# Patient Record
Sex: Female | Born: 1940 | Race: Black or African American | Hispanic: No | Smoking: Never smoker
Health system: Southern US, Community
[De-identification: ages and names within clinical notes are randomized; demographics above are authoritative.]

## PROBLEM LIST (undated history)

## (undated) DIAGNOSIS — E669 Obesity, unspecified: Secondary | ICD-10-CM

## (undated) DIAGNOSIS — R778 Other specified abnormalities of plasma proteins: Secondary | ICD-10-CM

## (undated) DIAGNOSIS — J309 Allergic rhinitis, unspecified: Secondary | ICD-10-CM

## (undated) DIAGNOSIS — G9340 Encephalopathy, unspecified: Secondary | ICD-10-CM

## (undated) DIAGNOSIS — R7989 Other specified abnormal findings of blood chemistry: Secondary | ICD-10-CM

## (undated) DIAGNOSIS — I10 Essential (primary) hypertension: Secondary | ICD-10-CM

## (undated) DIAGNOSIS — E876 Hypokalemia: Secondary | ICD-10-CM

## (undated) HISTORY — DX: Allergic rhinitis, unspecified: J30.9

## (undated) HISTORY — PX: FOOT SURGERY: SHX648

## (undated) HISTORY — DX: Obesity, unspecified: E66.9

---

## 1998-01-15 ENCOUNTER — Ambulatory Visit (HOSPITAL_COMMUNITY): Admission: RE | Admit: 1998-01-15 | Discharge: 1998-01-15 | Payer: Self-pay | Admitting: Internal Medicine

## 1998-02-09 ENCOUNTER — Emergency Department (HOSPITAL_COMMUNITY): Admission: EM | Admit: 1998-02-09 | Discharge: 1998-02-09 | Payer: Self-pay | Admitting: Emergency Medicine

## 1998-06-02 ENCOUNTER — Ambulatory Visit (HOSPITAL_COMMUNITY): Admission: RE | Admit: 1998-06-02 | Discharge: 1998-06-02 | Payer: Self-pay | Admitting: *Deleted

## 1998-08-30 ENCOUNTER — Ambulatory Visit (HOSPITAL_COMMUNITY): Admission: RE | Admit: 1998-08-30 | Discharge: 1998-08-30 | Payer: Self-pay | Admitting: Internal Medicine

## 1999-08-31 ENCOUNTER — Other Ambulatory Visit: Admission: RE | Admit: 1999-08-31 | Discharge: 1999-08-31 | Payer: Self-pay | Admitting: Internal Medicine

## 2001-04-07 ENCOUNTER — Other Ambulatory Visit: Admission: RE | Admit: 2001-04-07 | Discharge: 2001-04-07 | Payer: Self-pay | Admitting: Emergency Medicine

## 2002-06-11 ENCOUNTER — Encounter: Admission: RE | Admit: 2002-06-11 | Discharge: 2002-06-11 | Payer: Self-pay | Admitting: Internal Medicine

## 2002-06-11 ENCOUNTER — Encounter: Payer: Self-pay | Admitting: Internal Medicine

## 2002-07-30 ENCOUNTER — Other Ambulatory Visit: Admission: RE | Admit: 2002-07-30 | Discharge: 2002-07-30 | Payer: Self-pay | Admitting: Internal Medicine

## 2003-09-30 ENCOUNTER — Encounter: Admission: RE | Admit: 2003-09-30 | Discharge: 2003-09-30 | Payer: Self-pay | Admitting: Internal Medicine

## 2004-05-18 ENCOUNTER — Other Ambulatory Visit: Admission: RE | Admit: 2004-05-18 | Discharge: 2004-05-18 | Payer: Self-pay | Admitting: Internal Medicine

## 2004-06-08 ENCOUNTER — Encounter: Admission: RE | Admit: 2004-06-08 | Discharge: 2004-06-08 | Payer: Self-pay | Admitting: Internal Medicine

## 2005-10-25 ENCOUNTER — Other Ambulatory Visit: Admission: RE | Admit: 2005-10-25 | Discharge: 2005-10-25 | Payer: Self-pay | Admitting: Internal Medicine

## 2005-11-01 ENCOUNTER — Encounter: Admission: RE | Admit: 2005-11-01 | Discharge: 2005-11-01 | Payer: Self-pay | Admitting: Internal Medicine

## 2009-03-27 ENCOUNTER — Other Ambulatory Visit: Admission: RE | Admit: 2009-03-27 | Discharge: 2009-03-27 | Payer: Self-pay | Admitting: Internal Medicine

## 2009-03-28 ENCOUNTER — Encounter: Admission: RE | Admit: 2009-03-28 | Discharge: 2009-03-28 | Payer: Self-pay | Admitting: Internal Medicine

## 2010-03-30 ENCOUNTER — Other Ambulatory Visit: Admission: RE | Admit: 2010-03-30 | Discharge: 2010-03-30 | Payer: Self-pay | Admitting: Internal Medicine

## 2010-04-06 ENCOUNTER — Encounter: Admission: RE | Admit: 2010-04-06 | Discharge: 2010-04-06 | Payer: Self-pay | Admitting: Internal Medicine

## 2011-06-30 ENCOUNTER — Emergency Department (HOSPITAL_COMMUNITY): Payer: BC Managed Care – PPO

## 2011-06-30 ENCOUNTER — Emergency Department (HOSPITAL_COMMUNITY)
Admission: EM | Admit: 2011-06-30 | Discharge: 2011-06-30 | Disposition: A | Discharge status: Home / Self Care | Payer: BC Managed Care – PPO | Attending: Emergency Medicine | Admitting: Emergency Medicine

## 2011-06-30 DIAGNOSIS — Z79899 Other long term (current) drug therapy: Secondary | ICD-10-CM | POA: Insufficient documentation

## 2011-06-30 DIAGNOSIS — S0083XA Contusion of other part of head, initial encounter: Secondary | ICD-10-CM | POA: Insufficient documentation

## 2011-06-30 DIAGNOSIS — S0003XA Contusion of scalp, initial encounter: Secondary | ICD-10-CM | POA: Insufficient documentation

## 2011-06-30 DIAGNOSIS — R220 Localized swelling, mass and lump, head: Secondary | ICD-10-CM | POA: Insufficient documentation

## 2011-11-08 ENCOUNTER — Observation Stay (HOSPITAL_COMMUNITY)
Admission: EM | Admit: 2011-11-08 | Discharge: 2011-11-09 | Disposition: A | Discharge status: Home Health | Payer: No Typology Code available for payment source | Attending: Orthopedic Surgery | Admitting: Orthopedic Surgery

## 2011-11-08 ENCOUNTER — Emergency Department (HOSPITAL_COMMUNITY): Payer: No Typology Code available for payment source

## 2011-11-08 ENCOUNTER — Encounter (HOSPITAL_COMMUNITY): Payer: Self-pay | Admitting: Physical Medicine and Rehabilitation

## 2011-11-08 ENCOUNTER — Inpatient Hospital Stay (HOSPITAL_COMMUNITY): Payer: No Typology Code available for payment source

## 2011-11-08 DIAGNOSIS — E119 Type 2 diabetes mellitus without complications: Secondary | ICD-10-CM | POA: Insufficient documentation

## 2011-11-08 DIAGNOSIS — Y998 Other external cause status: Secondary | ICD-10-CM | POA: Insufficient documentation

## 2011-11-08 DIAGNOSIS — M2040 Other hammer toe(s) (acquired), unspecified foot: Secondary | ICD-10-CM | POA: Insufficient documentation

## 2011-11-08 DIAGNOSIS — I10 Essential (primary) hypertension: Secondary | ICD-10-CM | POA: Insufficient documentation

## 2011-11-08 DIAGNOSIS — S92309B Fracture of unspecified metatarsal bone(s), unspecified foot, initial encounter for open fracture: Principal | ICD-10-CM | POA: Insufficient documentation

## 2011-11-08 DIAGNOSIS — Y92410 Unspecified street and highway as the place of occurrence of the external cause: Secondary | ICD-10-CM | POA: Insufficient documentation

## 2011-11-08 DIAGNOSIS — Z79899 Other long term (current) drug therapy: Secondary | ICD-10-CM | POA: Insufficient documentation

## 2011-11-08 DIAGNOSIS — R413 Other amnesia: Secondary | ICD-10-CM | POA: Insufficient documentation

## 2011-11-08 DIAGNOSIS — S93306A Unspecified dislocation of unspecified foot, initial encounter: Secondary | ICD-10-CM

## 2011-11-08 DIAGNOSIS — S92209B Fracture of unspecified tarsal bone(s) of unspecified foot, initial encounter for open fracture: Principal | ICD-10-CM | POA: Insufficient documentation

## 2011-11-08 DIAGNOSIS — S82899A Other fracture of unspecified lower leg, initial encounter for closed fracture: Secondary | ICD-10-CM

## 2011-11-08 HISTORY — DX: Essential (primary) hypertension: I10

## 2011-11-08 LAB — COMPREHENSIVE METABOLIC PANEL
ALT: 34 U/L (ref 0–35)
AST: 63 U/L — ABNORMAL HIGH (ref 0–37)
CO2: 23 mEq/L (ref 19–32)
Chloride: 105 mEq/L (ref 96–112)
GFR calc non Af Amer: 57 mL/min — ABNORMAL LOW (ref >90)
Potassium: 3.5 mEq/L (ref 3.5–5.1)
Sodium: 139 mEq/L (ref 135–145)
Total Bilirubin: 0.2 mg/dL — ABNORMAL LOW (ref 0.3–1.2)

## 2011-11-08 LAB — CBC
MCH: 30.9 pg (ref 26.0–34.0)
MCHC: 33.3 g/dL (ref 30.0–36.0)
Platelets: 223 10*3/uL (ref 150–400)

## 2011-11-08 LAB — POCT I-STAT, CHEM 8
BUN: 24 mg/dL — ABNORMAL HIGH (ref 6–23)
Calcium, Ion: 1.21 mmol/L (ref 1.12–1.32)
Chloride: 108 mEq/L (ref 96–112)
Glucose, Bld: 145 mg/dL — ABNORMAL HIGH (ref 70–99)
Potassium: 3.5 mEq/L (ref 3.5–5.1)

## 2011-11-08 LAB — LACTIC ACID, PLASMA: Lactic Acid, Venous: 0.8 mmol/L (ref 0.5–2.2)

## 2011-11-08 LAB — SAMPLE TO BLOOD BANK

## 2011-11-08 MED ORDER — PNEUMOCOCCAL VAC POLYVALENT 25 MCG/0.5ML IJ INJ
0.5000 mL | INJECTION | INTRAMUSCULAR | Status: AC
  Administered 2011-11-09: 0.5 mL via INTRAMUSCULAR
  Filled 2011-11-08: qty 0.5

## 2011-11-08 MED ORDER — PROPOFOL 10 MG/ML IV BOLUS
INTRAVENOUS | Status: AC | PRN
  Administered 2011-11-08: 75 mg via INTRAVENOUS

## 2011-11-08 MED ORDER — CEFAZOLIN SODIUM 1-5 GM-% IV SOLN
1.0000 g | Freq: Three times a day (TID) | INTRAVENOUS | Status: DC
  Administered 2011-11-09 (×2): 1 g via INTRAVENOUS
  Filled 2011-11-08 (×3): qty 50

## 2011-11-08 MED ORDER — POTASSIUM CHLORIDE IN NACL 20-0.9 MEQ/L-% IV SOLN
INTRAVENOUS | Status: DC
  Administered 2011-11-08: 20 mL via INTRAVENOUS
  Filled 2011-11-08 (×2): qty 1000

## 2011-11-08 MED ORDER — DOCUSATE SODIUM 100 MG PO CAPS
100.0000 mg | ORAL_CAPSULE | Freq: Two times a day (BID) | ORAL | Status: DC
  Administered 2011-11-08 – 2011-11-09 (×2): 100 mg via ORAL
  Filled 2011-11-08 (×2): qty 1

## 2011-11-08 MED ORDER — PROPOFOL BOLUS VIA INFUSION: 1.0000 mg/kg | Freq: Once | INTRAVENOUS | Status: DC

## 2011-11-08 MED ORDER — INSULIN ASPART 100 UNIT/ML ~~LOC~~ SOLN
0.0000 [IU] | Freq: Three times a day (TID) | SUBCUTANEOUS | Status: DC
  Administered 2011-11-09: 2 [IU] via SUBCUTANEOUS
  Filled 2011-11-08: qty 3

## 2011-11-08 MED ORDER — MORPHINE SULFATE 2 MG/ML IJ SOLN: 1.0000 mg | INTRAMUSCULAR | Status: DC | PRN

## 2011-11-08 MED ORDER — CEFAZOLIN SODIUM 1-5 GM-% IV SOLN
1.0000 g | Freq: Once | INTRAVENOUS | Status: AC
  Administered 2011-11-08: 1 g via INTRAVENOUS
  Filled 2011-11-08: qty 50

## 2011-11-08 MED ORDER — ACETAMINOPHEN 325 MG PO TABS: 325.0000 mg | ORAL_TABLET | Freq: Four times a day (QID) | ORAL | Status: DC | PRN

## 2011-11-08 MED ORDER — HYDROCODONE-ACETAMINOPHEN 5-325 MG PO TABS: 1.0000 | ORAL_TABLET | ORAL | Status: DC | PRN

## 2011-11-08 MED ORDER — PROPOFOL 10 MG/ML IV EMUL
INTRAVENOUS | Status: AC
  Filled 2011-11-08: qty 20
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
## 2011-11-08 NOTE — ED Notes (Signed)
Pt resting quietly at the time. Remains on cardiac monitor. Vital signs stable. Denies pain at present. Family at the bedside. Pt awaiting on bed placement, pt and family updated on plan of care.
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
## 2011-11-08 NOTE — ED Notes (Signed)
[employer] paged for ankle reduction.
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
## 2011-11-08 NOTE — ED Notes (Signed)
[id number] Ready
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
## 2011-11-08 NOTE — ED Notes (Signed)
[employer] responded, will be down shortly

## 2011-11-08 NOTE — ED Notes (Signed)
Ortho tech at the bedside placing splint to L ankle/foot. Pt tolerating without difficulty. She is alert and oriented x4. Vital signs stable. Remains on cardiac monitor.
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
## 2011-11-08 NOTE — ED Notes (Signed)
Pt transported to CT scan. Vital signs stable.  

## 2011-11-08 NOTE — ED Notes (Signed)
Reduction of L ankle complete. Pt tolerated without difficulty. Pt remains on cardiac monitor, vital signs stable. Will continue to monitor.
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
## 2011-11-08 NOTE — Progress Notes (Signed)
Orthopedic Tech Progress Note Patient Details:  Stephanie Gould 03-May-1941 [id number]  Other Ortho Devices Type of Ortho Device: Ace wrap Ortho Device Location: (L) LE Ortho Device Interventions: Application  Type of Splint: Stirrup;Post (short) Splint Location: (L) LE Splint Interventions: Application    [name] 11/08/2011, 5:03 PM

## 2011-11-08 NOTE — ED Notes (Signed)
[employer] paged for ankle reduction.

## 2011-11-08 NOTE — ED Notes (Signed)
Pt resting quietly at the time. Remains on cardiac monitor. Vital signs stable. Returned to baseline (see sedation charting). X-ray at the bedside.
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
## 2011-11-09 DIAGNOSIS — I10 Essential (primary) hypertension: Secondary | ICD-10-CM | POA: Insufficient documentation

## 2011-11-09 LAB — GLUCOSE, CAPILLARY: Glucose-Capillary: 85 mg/dL (ref 70–99)

## 2011-11-09 LAB — CALCIUM, IONIZED: Calcium, Ion: 1.24 mmol/L (ref 1.12–1.32)

## 2011-11-09 MED ORDER — HYDROCODONE-ACETAMINOPHEN 5-325 MG PO TABS: 1.0000 | ORAL_TABLET | Freq: Four times a day (QID) | ORAL | Status: AC | PRN

## 2011-11-09 MED ORDER — DSS 100 MG PO CAPS: 100.0000 mg | ORAL_CAPSULE | Freq: Two times a day (BID) | ORAL | Status: AC
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
## 2011-11-09 NOTE — Progress Notes (Signed)
   CARE MANAGEMENT NOTE 11/09/2011  Patient:  Stephanie Gould, Stephanie Gould   Account Number:  [id number]  Date Initiated:  11/09/2011  Documentation initiated by:  [facility]  Subjective/Objective Assessment:   Left Subtalar and talonavicular dislocation     Action/Plan:   Anticipated DC Date:  11/09/2011   Anticipated DC Plan:  HOME W HOME HEALTH SERVICES      DC Planning Services  CM consult      [facility] Choice  HOME HEALTH   Choice offered to / List presented to:  C-1 Patient   DME arranged  Levan Hurst      DME agency  [facility]     HH arranged  HH-2 PT      [facility] agency  [facility]   Status of service:  Completed, signed off Medicare Important Message given?   (If response is "NO", the following Medicare IM given date fields will be blank) Date Medicare IM given:   Date Additional Medicare IM given:    Discharge Disposition:  HOME W HOME HEALTH SERVICES  Per UR Regulation:    Comments:  11/09/11 1200 Spoke to pt and offered choice for [facility]. Requested [facility] for [facility]. Contacted [facility] for [facility] PT and DME for scheduled D/C today. Isidoro Donning RN CCM Case Mgmt phone [phone]

## 2011-11-09 NOTE — Discharge Instructions (Signed)
Orth[facility]edic [facility] Discharge Instructions, Pin Site and Wound Care   General Discharge Instructions  WEIGHT BEARING STATUS: Nonweight Bearing Left Leg  RANGE OF MOTION/ACTIVITY: Activity as tolerated while maintaining Weight Bearing Restrictions  IF YOU ARE IN A SPLINT OR CAST DO NOT REMOVE IT FOR ANY REASON   If your splint gets wet for any reason please contact the office immediately. You may shower in your splint or cast as long as you keep it dry.  This can be done by wrapping in a cast cover or garbage back (or similar)  Do Not stick any thing down your splint or cast such as pencils, money, or hangers to try and scratch yourself with.  If you feel itchy take benadryl as prescribed on the bottle for itching  ICE AND ELEVATE INJURED/OPERATIVE EXTREMITY  Using ice and elevating the injured extremity above your heart can help with swelling and pain control.  Icing in a pulsatile fashion, such as 20 minutes on and 20 minutes off, can be followed.    Do not place ice directly on skin. Make sure there is a barrier between to skin and the ice pack.    Using frozen items such as frozen peas works well as the conform nicely to the are that needs to be iced.   STOP SMOKING OR USING NICOTINE PRODUCTS!!!!  As discussed nicotine severely impairs your body's ability to heal surgical and traumatic wounds but also impairs bone healing.  Wounds and bone heal by forming microscopic blood vessels (angiogenesis) and nicotine is a vasoconstrictor (essentially, shrinks blood vessels).  Therefore, if vasoconstriction occurs to these microscopic blood vessels they essentially disappear and are unable to deliver necessary nutrients to the healing tissue.  This is one modifiable factor that you can do to dramatically increase your chances of healing your injury.    (This means no smoking, no nicotine gum, patches, etc)  DO NOT USE NONSTEROIDAL ANTI-INFLAMMATORY DRUGS (NSAID'S)  Using products such as  Advil (ibuprofen), Aleve (naproxen), Motrin (ibuprofen) for additional pain control during fracture healing can delay and/or prevent the healing response.  If you would like to take over the counter (OTC) medication, Tylenol (acetaminophen) is ok.  However, some narcotic medications that are given for pain control contain acetaminophen as well. Therefore, you should not exceed more than 4000 mg of tylenol in a day if you do not have liver disease.  Also note that there are may OTC medicines, such as cold medicines and allergy medicines that my contain tylenol as well.  If you have any questions about medications and/or interactions please ask your doctor/PA or your pharmacist.   PAIN MEDICATION USE AND EXPECTATIONS  You have likely been given narcotic medications to help control your pain.  After a traumatic event that results in an fracture (broken bone) with or without surgery, it is ok to use narcotic pain medications to help control one's pain.  We understand that everyone responds to pain differently and each individual patient will be evaluated on a regular basis for the continued need for narcotic medications. Ideally, narcotic medication use should last no more than 6-8 weeks (coinciding with fracture healing).   As a patient it is your responsibility as well to monitor narcotic medication use and report the amount and frequency you use these medications when you come to your office visit.   We would also advise that if you are using narcotic medications, you should take a dose prior to therapy to maximize you participation.  IF YOU ARE ON NARCOTIC MEDICATIONS IT IS NOT PERMISSIBLE TO OPERATE A MOTOR VEHICLE (MOTORCYCLE/CAR/TRUCK/MOPED) OR HEAVY MACHINERY DO NOT MIX NARCOTICS WITH OTHER CNS (CENTRAL NERVOUS SYSTEM) DEPRESSANTS SUCH AS ALCOHOL     USE AN ACE WRAP OR TED HOSE FOR SWELLING CONTROL  In addition to icing and elevation, Ace wraps or TED hose are used to help limit and resolve  swelling.  It is recommended to use Ace wraps or TED hose until you are informed to stop.    When using Ace Wraps start the wrapping distally (farthest away from the body) and wrap proximally (closer to the body)   Example: If you had surgery on your leg or thing and you do not have a splint on, start the ace wrap at the toes and work your way up to the thigh        If you had surgery on your upper extremity and do not have a splint on, start the ace wrap at your fingers and work your way up to the upper arm  CALL THE OFFICE WITH ANY QUESTIONS OR CONCERTS: [phone]     Discharge Pin Site Instructions  Dress pins daily with Kerlix roll starting on POD 2. Wrap the Kerlix so that it tamps the skin down around the pin-skin interface to prevent/limit motion of the skin relative to the pin.  (Pin-skin motion is the primary cause of pain and infection related to external fixator pin sites).  Remove any crust or coagulum that may obstruct drainage with a saline moistened gauze or soap and water.  After POD 3, if there is no discernable drainage on the pin site dressing, the interval for change can by increased to every other day.  You may shower with the fixator, cleaning all pin sites gently with soap and water.  If you have a surgical wound this needs to be completely dry and without drainage before showering.  The extremity can be lifted by the fixator to facilitate wound care and transfers.  Notify the office/Doctor if you experience increasing drainage, redness, or pain from a pin site, or if you notice purulent (thick, snot-like) drainage.  Discharge Wound Care Instructions  Do NOT apply any ointments, solutions or lotions to pin sites or surgical wounds.  These prevent needed drainage and even though solutions like hydrogen peroxide kill bacteria, they also damage cells lining the pin sites that help fight infection.  Applying lotions or ointments can keep the wounds moist and can cause  them to breakdown and open up as well. This can increase the risk for infection. When in doubt call the office.  Surgical incisions should be dressed daily.  If any drainage is noted, use one layer of adaptic, then gauze, Kerlix, and an ace wrap.  Once the incision is completely dry and without drainage, it may be left open to air out.  Showering may begin 36-48 hours later.  Cleaning gently with soap and water.  Traumatic wounds should be dressed daily as well.    One layer of adaptic, gauze, Kerlix, then ace wrap.  The adaptic can be discontinued once the draining has ceased    If you have a wet to dry dressing: wet the gauze with saline the squeeze as much saline out so the gauze is moist (not soaking wet), place moistened gauze over wound, then place a dry gauze over the moist one, followed by Kerlix wrap, then ace wrap.

## 2011-11-11 LAB — VITAMIN D 25 HYDROXY (VIT D DEFICIENCY, FRACTURES): Vit D, 25-Hydroxy: 14 ng/mL — ABNORMAL LOW (ref 30–89)

## 2011-11-13 LAB — VITAMIN D 1,25 DIHYDROXY
Vitamin D 1, 25 (OH)2 Total: 46 pg/mL (ref 18–72)
Vitamin D3 1, 25 (OH)2: 46 pg/mL

## 2011-11-14 NOTE — H&P (Signed)
I have seen and examined the patient. I agree with the findings above.  Budd Palmer, MD 11/14/2011 11:17 AM

## 2011-11-14 NOTE — H&P (Signed)
I have seen and examined the patient. I agree with the findings above.  Margaretmary Prisk H, MD 11/14/2011 11:17 AM   

## 2011-11-19 ENCOUNTER — Ambulatory Visit (HOSPITAL_COMMUNITY)
Admission: RE | Admit: 2011-11-19 | Discharge: 2011-11-19 | Disposition: A | Discharge status: Home / Self Care | Payer: BC Managed Care – PPO | Source: Ambulatory Visit | Attending: Internal Medicine | Admitting: Internal Medicine

## 2011-11-19 ENCOUNTER — Ambulatory Visit (HOSPITAL_COMMUNITY)
Admission: RE | Admit: 2011-11-19 | Payer: BC Managed Care – PPO | Source: Ambulatory Visit | Admitting: Internal Medicine

## 2011-11-19 DIAGNOSIS — M79609 Pain in unspecified limb: Secondary | ICD-10-CM | POA: Insufficient documentation

## 2011-11-19 DIAGNOSIS — R52 Pain, unspecified: Secondary | ICD-10-CM

## 2011-11-19 DIAGNOSIS — M7989 Other specified soft tissue disorders: Secondary | ICD-10-CM

## 2011-11-19 DIAGNOSIS — S8010XA Contusion of unspecified lower leg, initial encounter: Secondary | ICD-10-CM | POA: Insufficient documentation

## 2011-11-19 NOTE — Progress Notes (Signed)
VASCULAR LAB PRELIMINARY  PRELIMINARY  PRELIMINARY  PRELIMINARY  Left lower extremity venous duplex completed.    Preliminary report:  Left:  No obvious evidence of DVT, superficial thrombosis, or Baker's cyst.  Unable to image the calf veins due to cast.  Hematoma noted on the antero-lateral aspect of the calf just above the cast.  Terance Hart, RVT 11/19/2011, 2:00 PM

## 2012-02-20 ENCOUNTER — Ambulatory Visit (HOSPITAL_BASED_OUTPATIENT_CLINIC_OR_DEPARTMENT_OTHER): Payer: BC Managed Care – PPO | Attending: Internal Medicine

## 2012-02-20 VITALS — Ht 65.0 in | Wt 165.0 lb

## 2012-02-20 DIAGNOSIS — G4710 Hypersomnia, unspecified: Secondary | ICD-10-CM | POA: Insufficient documentation

## 2012-02-20 DIAGNOSIS — G4733 Obstructive sleep apnea (adult) (pediatric): Secondary | ICD-10-CM

## 2012-03-01 DIAGNOSIS — G4710 Hypersomnia, unspecified: Secondary | ICD-10-CM

## 2012-03-01 DIAGNOSIS — G4730 Sleep apnea, unspecified: Secondary | ICD-10-CM

## 2012-03-01 NOTE — Procedures (Signed)
NAMEZENAYA, ULATOWSKI               ACCOUNT NO.:  [id number]  MEDICAL RECORD NO.:  [id number]          PATIENT TYPE:  OUT  LOCATION:  SLEEP CENTER                 FACILITY:  [facility]  PHYSICIAN:  Kelven Flater D. Maple Hudson, MD, FCCP, FACPDATE OF BIRTH:  1941/03/29  DATE OF STUDY:  02/20/2012                           NOCTURNAL POLYSOMNOGRAM  REFERRING PHYSICIAN:  Eric L. [name], M.D.  INDICATION FOR STUDY:  Hypersomnia with sleep apnea.  EPWORTH SLEEPINESS SCORE:  3/24.  BMI 27.5, weight 165 pounds.  Height 65 inches, neck 13 inches.  MEDICATIONS:  Home medications charted and reviewed.  SLEEP ARCHITECTURE:  Total sleep time 245.5 minutes with sleep efficiency 62.5%.  Stage I was 9.4%, stage II 61.5%, stage III absent, REM 29.1% of total sleep time.  Sleep latency 117 minutes, REM latency 32 minutes. Awake after sleep onset 29.5 minutes.  Arousal index 10.3.  BEDTIME MEDICATION:  None.  Sleep onset was at about 12:45 a.m. with occasional brief waking throughout the night.  RESPIRATORY DATA:  Apnea-hypopnea index (AHI) 3.2 per hour.  A total of 13 events was scored, all as hypopneas.  Most were associated with supine sleep position, and REM.  REM/AHI 9.2 per hour.  There were insufficient numbers of events and sleep onset was too late to permit application of split protocol, CPAP titration on this study night.  OXYGEN DATA:  Moderately loud snoring with oxygen desaturation to a nadir of 88% on room air.  Mean oxygen saturation through the study was 93.6% on room air.  CARDIAC DATA:  Normal sinus rhythm.  MOVEMENT-PARASOMNIA:  No significant movement disturbance.  Bathroom x1.  IMPRESSIONS-RECOMMENDATIONS: 1. Occasional respiratory events with sleep disturbance, within normal     limits.  AHI 3.2 per hour (the normal range for adults is from 0-5     events per hour).  Moderately loud snoring with oxygen desaturation     to a nadir of 88% and mean oxygen saturation through the study of   93.6% on room air.  2. Delayed sleep onset until nearly 12:45 a.m. with subsequent     nonspecific waking through the night.  Consider if management as     insomnia would be clinically appropriate.     Jamyson Jirak D. Maple Hudson, MD, [facility], FACP Diplomate, American Board of Sleep Medicine    CDY/MEDQ  D:  03/01/2012 09:04:13  T:  03/01/2012 09:32:23  Job:  [id number]

## 2012-07-24 DIAGNOSIS — M1990 Unspecified osteoarthritis, unspecified site: Secondary | ICD-10-CM | POA: Insufficient documentation

## 2012-07-24 DIAGNOSIS — E119 Type 2 diabetes mellitus without complications: Secondary | ICD-10-CM | POA: Insufficient documentation

## 2012-09-28 DIAGNOSIS — M1990 Unspecified osteoarthritis, unspecified site: Secondary | ICD-10-CM

## 2012-09-28 DIAGNOSIS — J309 Allergic rhinitis, unspecified: Secondary | ICD-10-CM | POA: Insufficient documentation

## 2012-09-28 DIAGNOSIS — E119 Type 2 diabetes mellitus without complications: Secondary | ICD-10-CM

## 2014-03-04 ENCOUNTER — Encounter: Payer: Self-pay | Admitting: Podiatrist

## 2014-03-04 ENCOUNTER — Ambulatory Visit (INDEPENDENT_AMBULATORY_CARE_PROVIDER_SITE_OTHER): Payer: BC Managed Care – PPO | Admitting: Podiatrist

## 2014-03-04 ENCOUNTER — Ambulatory Visit (INDEPENDENT_AMBULATORY_CARE_PROVIDER_SITE_OTHER): Payer: BC Managed Care – PPO

## 2014-03-04 VITALS — BP 150/85 | HR 63 | Resp 14 | Ht 64.0 in | Wt 160.0 lb

## 2014-03-04 DIAGNOSIS — E119 Type 2 diabetes mellitus without complications: Secondary | ICD-10-CM

## 2014-03-04 DIAGNOSIS — M216X9 Other acquired deformities of unspecified foot: Secondary | ICD-10-CM

## 2014-03-04 DIAGNOSIS — M21619 Bunion of unspecified foot: Secondary | ICD-10-CM

## 2014-03-04 DIAGNOSIS — Q828 Other specified congenital malformations of skin: Secondary | ICD-10-CM

## 2014-03-04 DIAGNOSIS — M2040 Other hammer toe(s) (acquired), unspecified foot: Secondary | ICD-10-CM

## 2014-03-04 DIAGNOSIS — M21612 Bunion of left foot: Secondary | ICD-10-CM

## 2014-03-04 DIAGNOSIS — M216X2 Other acquired deformities of left foot: Secondary | ICD-10-CM

## 2014-03-04 NOTE — Patient Instructions (Signed)

## 2014-03-04 NOTE — Progress Notes (Signed)
   Subjective:    Patient ID: Stephanie Gould, female    DOB: 12/14/1940, 73 y.o.   MRN: [id number]  HPI Comments: Pt states she would like her callouses trimmed, and to discuss the left bunion and hammer toes, she has had for years.  Pt states the corn on top of the left 2nd toe is painful.     Review of Systems  All other systems reviewed and are negative.      Objective:   Physical Exam Patient is awake, alert, and oriented x 3.  In no acute distress.  Vascular status is intact with palpable pedal pulses at 2/4 DP and PT bilateral and capillary refill time within normal limits. Neurological sensation is also intact bilaterally via Semmes Weinstein monofilament at 5/5 sites. Light touch, vibratory sensation, Achilles tendon reflex is intact. Dermatological exam reveals a painful callus on the top of the left 2nd toe.  Otherwise skin color, turger and texture as normal. No open lesions present.  Musculature intact with dorsiflexion, plantarflexion, inversion, eversion. Large bunion deformity is present on the left foot.  Contracture deformity of the 2nd digit is also present.  Prior bunion surgery on the right foot has been performed in the past.  Residual contracture of the 2nd toe continues to be present right despite prior surgical intervention.      Assessment & Plan:  Bunion, hammertoe, callus  Plan:  Debrided the callus and discussed padding.  Recommended orthotics first to try and decrease the pain.  Discussed surgical intervention would likely be needed in the future if the pain is worsening or not resolving.

## 2014-03-25 ENCOUNTER — Ambulatory Visit (INDEPENDENT_AMBULATORY_CARE_PROVIDER_SITE_OTHER): Payer: BC Managed Care – PPO | Admitting: Podiatrist

## 2014-03-25 VITALS — BP 132/70 | HR 58 | Resp 16

## 2014-03-25 DIAGNOSIS — E119 Type 2 diabetes mellitus without complications: Secondary | ICD-10-CM

## 2014-03-25 DIAGNOSIS — M216X2 Other acquired deformities of left foot: Secondary | ICD-10-CM

## 2014-03-25 DIAGNOSIS — M2040 Other hammer toe(s) (acquired), unspecified foot: Secondary | ICD-10-CM

## 2014-03-25 DIAGNOSIS — M216X9 Other acquired deformities of unspecified foot: Secondary | ICD-10-CM

## 2014-03-25 NOTE — Patient Instructions (Signed)

## 2014-03-25 NOTE — Progress Notes (Signed)
   Subjective:    Patient ID: Stephanie Gould, female    DOB: 02/13/1941, [age] y.o.   MRN: [id number]  HPI Comments: Pt presents for orthotic pick-up.  Oral and written wearing instructions were given and orthotic place in pt's shoes.  I encouraged pt to make an appt for 1 month follow up with Dr. Irving ShowsEgerton.     Review of Systems     Objective:   Physical Exam        Assessment & Plan:

## 2014-04-22 ENCOUNTER — Ambulatory Visit (INDEPENDENT_AMBULATORY_CARE_PROVIDER_SITE_OTHER): Payer: Medicare Other | Admitting: Podiatrist

## 2014-04-22 VITALS — BP 133/72 | HR 70 | Resp 18

## 2014-04-22 DIAGNOSIS — M21619 Bunion of unspecified foot: Secondary | ICD-10-CM

## 2014-04-22 DIAGNOSIS — M21612 Bunion of left foot: Secondary | ICD-10-CM

## 2014-04-22 DIAGNOSIS — M216X9 Other acquired deformities of unspecified foot: Secondary | ICD-10-CM

## 2014-04-22 DIAGNOSIS — M216X2 Other acquired deformities of left foot: Secondary | ICD-10-CM

## 2014-04-22 NOTE — Progress Notes (Signed)
Subjective: Patient presents today for followup of orthotics. She states overall there comfortable but the left one does feel bulky at times. Overall however she relates they're feeling well and would not like any adjustments. She does state today her bunion is bothering her. She relates it hurts in her shoes and that the knot on the side of her foot is severely uncomfortable. She would like to know if she could consider surgery.  Objective: Neurovascular status is intact. She has palpable pedal pulses and neurological sensation intact. She has a moderate to severe bunion deformity on the left foot and lateral deviation of lesser digits. The right foot had a bunion in the past which was surgically corrected. X-rays again are reviewed and reveal a large bunion deformity left. atavistic cuneiform is present.  Assessment: Well fitting orthotics, large bunion deformity left  Plan: Discussed the orthotics and they do they do appear to contour and fit her feet nicely. The padding appears to be in the good position and offloading as prescribed. We discussed her large bunion deformity on the left foot. I discussed in detail that the way to fix the bunion is with a fusion at the base of the toe however this would require being in a cast and be nonweightbearing for 6 weeks while the foot healed. The patient relates she would like the foot fixed however she just wants the bump gone. I discussed that we could do a head procedure to help minimize the bump and she wishes to have this performed. The risks and benefits of surgery were discussed and the 3 page consent form was also discussed. The patient's questions were encouraged and answered to the best of my ability. A air fracture walker will be dispensed postoperatively and she knows that she will have to wear this for 2 weeks. I will see her for the surgery and if any problems or concerns arise in the meantime she will call.

## 2014-04-22 NOTE — Patient Instructions (Signed)
Pre-Operative Instructions  Congratulations, you have decided to take an important step to improving your quality of life.  You can be assured that the doctors of [facility] will be with you every step of the way.  1. Plan to be at the surgery center/hospital at least 1 (one) hour prior to your scheduled time unless otherwise directed by the surgical center/hospital staff.  You must have a responsible adult accompany you, remain during the surgery and drive you home.  Make sure you have directions to the surgical center/hospital and know how to get there on time. 2. For hospital based surgery you will need to obtain a history and physical form from your family physician within 1 month prior to the date of surgery- we will give you a form for you primary physician.  3. We make every effort to accommodate the date you request for surgery.  There are however, times where surgery dates or times have to be moved.  We will contact you as soon as possible if a change in schedule is required.   4. No Aspirin/Ibuprofen for one week before surgery.  If you are on aspirin, any non-steroidal anti-inflammatory medications (Mobic, Aleve, Ibuprofen) you should stop taking it 7 days prior to your surgery.  You make take Tylenol  For pain prior to surgery.  5. Medications- If you are taking daily heart and blood pressure medications, seizure, reflux, allergy, asthma, anxiety, pain or diabetes medications, make sure the surgery center/hospital is aware before the day of surgery so they may notify you which medications to take or avoid the day of surgery. 6. No food or drink after midnight the night before surgery unless directed otherwise by surgical center/hospital staff. 7. No alcoholic beverages 24 hours prior to surgery.  No smoking 24 hours prior to or 24 hours after surgery. 8. Wear loose pants or shorts- loose enough to fit over bandages, boots, and casts. 9. No slip on shoes, sneakers are best. 10. Bring  your boot with you to the surgery center/hospital.  Also bring crutches or a walker if your physician has prescribed it for you.  If you do not have this equipment, it will be provided for you after surgery. 11. If you have not been contracted by the surgery center/hospital by the day before your surgery, call to confirm the date and time of your surgery. 12. Leave-time from work may vary depending on the type of surgery you have.  Appropriate arrangements should be made prior to surgery with your employer. 13. Prescriptions will be provided immediately following surgery by your doctor.  Have these filled as soon as possible after surgery and take the medication as directed. 14. Remove nail polish on the operative foot. 15. Wash the night before surgery.  The night before surgery wash the foot and leg well with the antibacterial soap provided and water paying special attention to beneath the toenails and in between the toes.  Rinse thoroughly with water and dry well with a towel.  Perform this wash unless told not to do so by your physician.  Enclosed: 1 Ice pack (please put in freezer the night before surgery)   1 Hibiclens skin cleaner   Pre-op Instructions  If you have any questions regarding the instructions, do not hesitate to call our office.  [place]: [address] [place], [place] [address] [phone]  [place]: [address] [place], [place] [address] [phone]  [place]: [address]  [place], [place] [address] [phone]  Dr. Richard   Tuchman DPM, Dr. Norman Regal DPM Dr. Richard Sikora DPM, Dr. M. Todd Hyatt DPM, Dr. Ashrita Chrismer DPM 

## 2014-06-08 DIAGNOSIS — M2010 Hallux valgus (acquired), unspecified foot: Secondary | ICD-10-CM

## 2014-06-16 ENCOUNTER — Ambulatory Visit (INDEPENDENT_AMBULATORY_CARE_PROVIDER_SITE_OTHER): Payer: Medicare Other | Admitting: Podiatrist

## 2014-06-16 ENCOUNTER — Encounter: Payer: Self-pay | Admitting: Podiatrist

## 2014-06-16 ENCOUNTER — Ambulatory Visit (INDEPENDENT_AMBULATORY_CARE_PROVIDER_SITE_OTHER): Payer: Medicare Other

## 2014-06-16 VITALS — BP 122/85 | HR 65 | Resp 16

## 2014-06-16 DIAGNOSIS — Z9889 Other specified postprocedural states: Secondary | ICD-10-CM

## 2014-06-16 DIAGNOSIS — M2012 Hallux valgus (acquired), left foot: Secondary | ICD-10-CM

## 2014-06-16 DIAGNOSIS — M21612 Bunion of left foot: Secondary | ICD-10-CM

## 2014-06-16 NOTE — Progress Notes (Signed)
   Subjective:    Patient ID: Stephanie Gould, female    DOB: 08-10-1941, 73 y.o.   MRN: [id number]  HPI  Pt presents for POV #1, doing well, noted mild swelling, wound edges aligned, no drainage noted, afebrile  Review of Systems     Objective:   Physical Exam Neurovascular status intact.  Left foot incision site is healing well.  Edges well coapted.  No redness , streaking or sign of infection noted.  Swelling consistent with post op bunion correction present.  Lateral deviation of hallux still present but not painful.  xrays show austin bunion correction with screw fixation in good alignment and position.       Assessment & Plan:  Good post op progress status post austin bunion left foot.  Plan:  Foot is redressed in a dry, sterile, compressive dressing.  She will stay in the boot. Her sutures will be removed in a week.  If any concerns arise prior to that visit she will call.

## 2014-07-08 ENCOUNTER — Ambulatory Visit (INDEPENDENT_AMBULATORY_CARE_PROVIDER_SITE_OTHER): Payer: Medicare Other

## 2014-07-08 ENCOUNTER — Ambulatory Visit (INDEPENDENT_AMBULATORY_CARE_PROVIDER_SITE_OTHER): Payer: Medicare Other | Admitting: Podiatrist

## 2014-07-08 ENCOUNTER — Encounter: Payer: Self-pay | Admitting: Podiatrist

## 2014-07-08 VITALS — BP 125/68 | HR 74 | Resp 13

## 2014-07-08 DIAGNOSIS — Z9889 Other specified postprocedural states: Secondary | ICD-10-CM

## 2014-07-08 DIAGNOSIS — M2012 Hallux valgus (acquired), left foot: Secondary | ICD-10-CM

## 2014-07-08 DIAGNOSIS — M21612 Bunion of left foot: Secondary | ICD-10-CM

## 2014-07-08 NOTE — Progress Notes (Signed)
Subjective: Patient presents today for follow-up of left bunion surgery. She states her foot is doing well. She has been doing her workouts and wearing her large boot. Overall she states she is doing well.  Physical Exam  Neurovascular status intact. Left foot incision site is healed.   No redness , streaking or sign of infection noted. Swelling consistent with post op bunion correction present. Lateral deviation of hallux still present but not painful.   xrays show austin bunion correction with screw fixation in good alignment and position.  Assessment & Plan:   Good post op progress status post austin bunion left foot.  Plan: discussed that she can now get into a good supportive sneaker however if she still wants to work out a recommend continued use of her large boot. She will be seen back in 4 weeks for recheck. He problems or concerns arise prior to that visit she will call.

## 2014-08-12 ENCOUNTER — Ambulatory Visit (INDEPENDENT_AMBULATORY_CARE_PROVIDER_SITE_OTHER): Payer: Medicare Other

## 2014-08-12 ENCOUNTER — Encounter: Payer: Self-pay | Admitting: Podiatrist

## 2014-08-12 ENCOUNTER — Ambulatory Visit (INDEPENDENT_AMBULATORY_CARE_PROVIDER_SITE_OTHER): Payer: Medicare Other | Admitting: Podiatrist

## 2014-08-12 VITALS — BP 132/69 | HR 73 | Resp 16

## 2014-08-12 DIAGNOSIS — M2012 Hallux valgus (acquired), left foot: Secondary | ICD-10-CM

## 2014-08-12 DIAGNOSIS — Z9889 Other specified postprocedural states: Secondary | ICD-10-CM

## 2014-08-15 NOTE — Progress Notes (Signed)
Subjective: Patient presents today for follow-up of left bunion surgery. She states her foot is doing well.  She has been wearing her supportive sneakers and doing her range of motion exercises as instructed  Physical Exam  Neurovascular status intact. Left foot incision site is healed.   No redness , streaking or sign of infection noted.  Continued Swelling consistent with post op bunion correction present. Lateral deviation of hallux still present but reducable and not painful.   xrays show austin bunion correction with screw fixation in good alignment and position- healing nicely.  Assessment & Plan:   Good post op progress status post austin bunion left foot.  Plan: discussed using a brace to bring over the hallux into a more rectus position.  She will stay in her supportive sneaker-- will recheck again in 4 weeks

## 2014-09-08 ENCOUNTER — Ambulatory Visit (INDEPENDENT_AMBULATORY_CARE_PROVIDER_SITE_OTHER): Payer: Medicare Other

## 2014-09-08 ENCOUNTER — Ambulatory Visit (INDEPENDENT_AMBULATORY_CARE_PROVIDER_SITE_OTHER): Payer: Medicare Other | Admitting: Podiatrist

## 2014-09-08 VITALS — BP 130/70 | HR 70 | Resp 16

## 2014-09-08 DIAGNOSIS — M21612 Bunion of left foot: Secondary | ICD-10-CM

## 2014-09-08 DIAGNOSIS — Z9889 Other specified postprocedural states: Secondary | ICD-10-CM

## 2014-09-08 DIAGNOSIS — M2012 Hallux valgus (acquired), left foot: Secondary | ICD-10-CM

## 2014-09-08 NOTE — Progress Notes (Signed)
  Subjective: Patient presents today for 3 mo post op follow up left bunion surgery. She states her foot is doing well.  She has been wearing her supportive sneakers and doing her range of motion exercises as instructed.  She states she is happy with the progress.  Physical Exam  Neurovascular status intact. Left foot incision site is healed.   No redness , streaking or sign of infection noted.  Continued Swelling consistent with post op bunion correction present. Lateral deviation of hallux still present but reducable and not painful.   xrays show austin bunion correction with screw fixation in good alignment and position- healing nicely.  Assessment & Plan:   Good post op progress status post austin bunion left foot.  Plan: Recommended continued range of motion exercises. She may increase her activity as tolerated. At this point she's discharge for her postoperative follow-up phase and if she has any concerns or problems she is instructed to call immediately.

## 2015-05-22 ENCOUNTER — Other Ambulatory Visit: Payer: Self-pay | Admitting: Internal Medicine

## 2015-05-22 ENCOUNTER — Ambulatory Visit
Admission: RE | Admit: 2015-05-22 | Discharge: 2015-05-22 | Disposition: A | Discharge status: Home / Self Care | Payer: Medicare Other | Source: Ambulatory Visit | Attending: Internal Medicine | Admitting: Internal Medicine

## 2015-05-22 DIAGNOSIS — M25561 Pain in right knee: Secondary | ICD-10-CM

## 2018-03-06 ENCOUNTER — Emergency Department (HOSPITAL_COMMUNITY)
Admission: EM | Admit: 2018-03-06 | Discharge: 2018-03-07 | Disposition: A | Discharge status: Home / Self Care | Payer: Medicare Other | Attending: Emergency Medicine | Admitting: Emergency Medicine

## 2018-03-06 ENCOUNTER — Other Ambulatory Visit: Payer: Self-pay

## 2018-03-06 ENCOUNTER — Encounter (HOSPITAL_COMMUNITY): Payer: Self-pay | Admitting: Emergency Medicine

## 2018-03-06 DIAGNOSIS — Z79899 Other long term (current) drug therapy: Secondary | ICD-10-CM | POA: Insufficient documentation

## 2018-03-06 DIAGNOSIS — I10 Essential (primary) hypertension: Secondary | ICD-10-CM | POA: Diagnosis not present

## 2018-03-06 DIAGNOSIS — R739 Hyperglycemia, unspecified: Secondary | ICD-10-CM

## 2018-03-06 DIAGNOSIS — N3000 Acute cystitis without hematuria: Secondary | ICD-10-CM | POA: Insufficient documentation

## 2018-03-06 DIAGNOSIS — E1165 Type 2 diabetes mellitus with hyperglycemia: Secondary | ICD-10-CM | POA: Diagnosis not present

## 2018-03-06 LAB — BASIC METABOLIC PANEL
Anion gap: 10 (ref 5–15)
BUN: 34 mg/dL — ABNORMAL HIGH (ref 8–23)
CALCIUM: 9.6 mg/dL (ref 8.9–10.3)
CO2: 26 mmol/L (ref 22–32)
CREATININE: 1.04 mg/dL — AB (ref 0.44–1.00)
Chloride: 102 mmol/L (ref 98–111)
GFR calc Af Amer: 59 mL/min — ABNORMAL LOW (ref >60)
GFR, EST NON AFRICAN AMERICAN: 50 mL/min — AB (ref >60)
GLUCOSE: 334 mg/dL — AB (ref 70–99)
Potassium: 3.6 mmol/L (ref 3.5–5.1)
Sodium: 138 mmol/L (ref 135–145)

## 2018-03-06 LAB — CBC
HCT: 38.8 % (ref 36.0–46.0)
Hemoglobin: 12.7 g/dL (ref 12.0–15.0)
MCH: 30.5 pg (ref 26.0–34.0)
MCHC: 32.7 g/dL (ref 30.0–36.0)
MCV: 93 fL (ref 78.0–100.0)
Platelets: 262 10*3/uL (ref 150–400)
RBC: 4.17 MIL/uL (ref 3.87–5.11)
RDW: 12.8 % (ref 11.5–15.5)
WBC: 5.9 10*3/uL (ref 4.0–10.5)

## 2018-03-06 LAB — URINALYSIS, ROUTINE W REFLEX MICROSCOPIC
Bilirubin Urine: NEGATIVE
Glucose, UA: >=500 mg/dL — AB
Ketones, ur: NEGATIVE mg/dL
Leukocytes, UA: NEGATIVE
NITRITE: POSITIVE — AB
Protein, ur: NEGATIVE mg/dL
SPECIFIC GRAVITY, URINE: 1.023 (ref 1.005–1.030)
pH: 5 (ref 5.0–8.0)

## 2018-03-06 LAB — CBG MONITORING, ED: GLUCOSE-CAPILLARY: 306 mg/dL — AB (ref 70–99)

## 2018-03-06 NOTE — ED Notes (Signed)
No answer for triage x1 

## 2018-03-06 NOTE — ED Triage Notes (Addendum)
Family reports pt's "A1C was 17 last week at MD office".  States pt not taking insulin since December.  States CBG at home 365 today.  Pt states she feels fine. Denies thirst and frequent urination.

## 2018-03-07 DIAGNOSIS — E1165 Type 2 diabetes mellitus with hyperglycemia: Secondary | ICD-10-CM | POA: Diagnosis not present

## 2018-03-07 LAB — CBG MONITORING, ED: GLUCOSE-CAPILLARY: 238 mg/dL — AB (ref 70–99)

## 2018-03-07 MED ORDER — CEPHALEXIN 500 MG PO CAPS: 500.0000 mg | ORAL_CAPSULE | Freq: Four times a day (QID) | ORAL | 0 refills | Status: DC

## 2018-03-07 MED ORDER — CEPHALEXIN 250 MG PO CAPS
500.0000 mg | ORAL_CAPSULE | Freq: Once | ORAL | Status: AC
  Administered 2018-03-07: 500 mg via ORAL
  Filled 2018-03-07: qty 2
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
## 2018-03-07 NOTE — Discharge Instructions (Addendum)
Please take the medications prescribed to treat your UTI. See your doctor in 1 week for optimal diabetes control.  Please return to the ER if your symptoms worsen; you have increased pain, fevers, chills, inability to keep any medications down, confusion.

## 2018-04-20 ENCOUNTER — Encounter (HOSPITAL_COMMUNITY): Payer: Self-pay | Admitting: Emergency Medicine

## 2018-04-20 ENCOUNTER — Emergency Department (HOSPITAL_COMMUNITY): Payer: Medicare Other

## 2018-04-20 ENCOUNTER — Inpatient Hospital Stay (HOSPITAL_COMMUNITY)
Admission: EM | Admit: 2018-04-20 | Discharge: 2018-04-24 | DRG: 072 | Disposition: A | Discharge status: Skilled Nursing Facility | Payer: Medicare Other | Attending: Family Medicine | Admitting: Family Medicine

## 2018-04-20 DIAGNOSIS — Z9114 Patient's other noncompliance with medication regimen: Secondary | ICD-10-CM

## 2018-04-20 DIAGNOSIS — I10 Essential (primary) hypertension: Secondary | ICD-10-CM | POA: Diagnosis present

## 2018-04-20 DIAGNOSIS — E876 Hypokalemia: Secondary | ICD-10-CM | POA: Diagnosis not present

## 2018-04-20 DIAGNOSIS — R7989 Other specified abnormal findings of blood chemistry: Secondary | ICD-10-CM | POA: Diagnosis present

## 2018-04-20 DIAGNOSIS — F0390 Unspecified dementia without behavioral disturbance: Secondary | ICD-10-CM | POA: Diagnosis present

## 2018-04-20 DIAGNOSIS — R778 Other specified abnormalities of plasma proteins: Secondary | ICD-10-CM

## 2018-04-20 DIAGNOSIS — E119 Type 2 diabetes mellitus without complications: Secondary | ICD-10-CM

## 2018-04-20 DIAGNOSIS — G9340 Encephalopathy, unspecified: Secondary | ICD-10-CM | POA: Diagnosis present

## 2018-04-20 DIAGNOSIS — I5030 Unspecified diastolic (congestive) heart failure: Secondary | ICD-10-CM | POA: Diagnosis not present

## 2018-04-20 DIAGNOSIS — E669 Obesity, unspecified: Secondary | ICD-10-CM | POA: Diagnosis present

## 2018-04-20 DIAGNOSIS — Z6827 Body mass index (BMI) 27.0-27.9, adult: Secondary | ICD-10-CM

## 2018-04-20 HISTORY — DX: Encephalopathy, unspecified: G93.40

## 2018-04-20 HISTORY — DX: Other specified abnormalities of plasma proteins: R77.8

## 2018-04-20 HISTORY — DX: Other specified abnormal findings of blood chemistry: R79.89

## 2018-04-20 HISTORY — DX: Hypokalemia: E87.6

## 2018-04-20 LAB — COMPREHENSIVE METABOLIC PANEL
ALBUMIN: 3.9 g/dL (ref 3.5–5.0)
ALK PHOS: 58 U/L (ref 38–126)
ALT: 22 U/L (ref 0–44)
ANION GAP: 10 (ref 5–15)
AST: 43 U/L — ABNORMAL HIGH (ref 15–41)
BILIRUBIN TOTAL: 1 mg/dL (ref 0.3–1.2)
BUN: 24 mg/dL — AB (ref 8–23)
CALCIUM: 9.1 mg/dL (ref 8.9–10.3)
CO2: 25 mmol/L (ref 22–32)
CREATININE: 1.1 mg/dL — AB (ref 0.44–1.00)
Chloride: 108 mmol/L (ref 98–111)
GFR calc Af Amer: 54 mL/min — ABNORMAL LOW (ref >60)
GFR calc non Af Amer: 47 mL/min — ABNORMAL LOW (ref >60)
GLUCOSE: 146 mg/dL — AB (ref 70–99)
Potassium: 2.9 mmol/L — ABNORMAL LOW (ref 3.5–5.1)
Sodium: 143 mmol/L (ref 135–145)
TOTAL PROTEIN: 7.2 g/dL (ref 6.5–8.1)

## 2018-04-20 LAB — CBC WITH DIFFERENTIAL/PLATELET
Abs Immature Granulocytes: 0 10*3/uL (ref 0.0–0.1)
BASOS ABS: 0 10*3/uL (ref 0.0–0.1)
Basophils Relative: 0 %
EOS ABS: 0 10*3/uL (ref 0.0–0.7)
EOS PCT: 0 %
HCT: 35.6 % — ABNORMAL LOW (ref 36.0–46.0)
Hemoglobin: 11.5 g/dL — ABNORMAL LOW (ref 12.0–15.0)
Immature Granulocytes: 0 %
Lymphocytes Relative: 14 %
Lymphs Abs: 1 10*3/uL (ref 0.7–4.0)
MCH: 30.7 pg (ref 26.0–34.0)
MCHC: 32.3 g/dL (ref 30.0–36.0)
MCV: 94.9 fL (ref 78.0–100.0)
MONOS PCT: 9 %
Monocytes Absolute: 0.6 10*3/uL (ref 0.1–1.0)
NEUTROS ABS: 5.5 10*3/uL (ref 1.7–7.7)
Neutrophils Relative %: 77 %
PLATELETS: 226 10*3/uL (ref 150–400)
RBC: 3.75 MIL/uL — ABNORMAL LOW (ref 3.87–5.11)
RDW: 13.6 % (ref 11.5–15.5)
WBC: 7.2 10*3/uL (ref 4.0–10.5)

## 2018-04-20 LAB — I-STAT TROPONIN, ED: TROPONIN I, POC: 0.39 ng/mL — AB (ref 0.00–0.08)

## 2018-04-20 LAB — GLUCOSE, CAPILLARY
GLUCOSE-CAPILLARY: 69 mg/dL — AB (ref 70–99)
GLUCOSE-CAPILLARY: 130 mg/dL — AB (ref 70–99)
Glucose-Capillary: 171 mg/dL — ABNORMAL HIGH (ref 70–99)

## 2018-04-20 LAB — I-STAT CHEM 8, ED
BUN: 29 mg/dL — ABNORMAL HIGH (ref 8–23)
CALCIUM ION: 1.16 mmol/L (ref 1.15–1.40)
Chloride: 107 mmol/L (ref 98–111)
Creatinine, Ser: 0.9 mg/dL (ref 0.44–1.00)
Glucose, Bld: 138 mg/dL — ABNORMAL HIGH (ref 70–99)
HCT: 36 % (ref 36.0–46.0)
HEMOGLOBIN: 12.2 g/dL (ref 12.0–15.0)
POTASSIUM: 3 mmol/L — AB (ref 3.5–5.1)
SODIUM: 145 mmol/L (ref 135–145)
TCO2: 25 mmol/L (ref 22–32)

## 2018-04-20 LAB — URINALYSIS, COMPLETE (UACMP) WITH MICROSCOPIC
Bilirubin Urine: NEGATIVE
GLUCOSE, UA: NEGATIVE mg/dL
KETONES UR: 5 mg/dL — AB
Nitrite: NEGATIVE
PROTEIN: 30 mg/dL — AB
Specific Gravity, Urine: 1.024 (ref 1.005–1.030)
pH: 5 (ref 5.0–8.0)

## 2018-04-20 LAB — RAPID URINE DRUG SCREEN, HOSP PERFORMED
Amphetamines: NOT DETECTED
BARBITURATES: NOT DETECTED
Benzodiazepines: NOT DETECTED
Cocaine: NOT DETECTED
Opiates: NOT DETECTED
TETRAHYDROCANNABINOL: NOT DETECTED

## 2018-04-20 LAB — ETHANOL: Alcohol, Ethyl (B): <10 mg/dL (ref <10)

## 2018-04-20 LAB — TROPONIN I: Troponin I: 0.24 ng/mL (ref <0.03)

## 2018-04-20 MED ORDER — ACETAMINOPHEN 325 MG PO TABS
650.0000 mg | ORAL_TABLET | Freq: Four times a day (QID) | ORAL | Status: DC | PRN
  Administered 2018-04-24: 650 mg via ORAL
  Filled 2018-04-20: qty 2

## 2018-04-20 MED ORDER — SODIUM CHLORIDE 0.9 % IV SOLN
INTRAVENOUS | Status: AC
  Administered 2018-04-20: 12:00:00 via INTRAVENOUS

## 2018-04-20 MED ORDER — ALPRAZOLAM 0.5 MG PO TABS
0.5000 mg | ORAL_TABLET | Freq: Two times a day (BID) | ORAL | Status: DC | PRN
  Administered 2018-04-22 – 2018-04-24 (×3): 0.5 mg via ORAL
  Filled 2018-04-20 (×4): qty 1

## 2018-04-20 MED ORDER — ENOXAPARIN SODIUM 40 MG/0.4ML ~~LOC~~ SOLN
40.0000 mg | SUBCUTANEOUS | Status: DC
  Administered 2018-04-20 – 2018-04-23 (×4): 40 mg via SUBCUTANEOUS
  Filled 2018-04-20 (×5): qty 0.4

## 2018-04-20 MED ORDER — INSULIN ASPART 100 UNIT/ML ~~LOC~~ SOLN: 0.0000 [IU] | Freq: Every day | SUBCUTANEOUS | Status: DC

## 2018-04-20 MED ORDER — SODIUM CHLORIDE 0.9 % IV SOLN
Freq: Once | INTRAVENOUS | Status: AC
  Administered 2018-04-20: 08:00:00 via INTRAVENOUS

## 2018-04-20 MED ORDER — POTASSIUM CHLORIDE CRYS ER 20 MEQ PO TBCR
40.0000 meq | EXTENDED_RELEASE_TABLET | Freq: Once | ORAL | Status: AC
  Administered 2018-04-20: 40 meq via ORAL
  Filled 2018-04-20: qty 2

## 2018-04-20 MED ORDER — ONDANSETRON HCL 4 MG/2ML IJ SOLN: 4.0000 mg | Freq: Four times a day (QID) | INTRAMUSCULAR | Status: DC | PRN

## 2018-04-20 MED ORDER — CALCIUM CARBONATE 1250 (500 CA) MG PO TABS
1.0000 | ORAL_TABLET | Freq: Every day | ORAL | Status: DC
  Administered 2018-04-21 – 2018-04-24 (×4): 500 mg via ORAL
  Filled 2018-04-20 (×4): qty 1

## 2018-04-20 MED ORDER — LATANOPROST 0.005 % OP SOLN
1.0000 [drp] | Freq: Every day | OPHTHALMIC | Status: DC
  Administered 2018-04-20 – 2018-04-23 (×4): 1 [drp] via OPHTHALMIC
  Filled 2018-04-20: qty 2.5

## 2018-04-20 MED ORDER — ACETAMINOPHEN 650 MG RE SUPP: 650.0000 mg | Freq: Four times a day (QID) | RECTAL | Status: DC | PRN

## 2018-04-20 MED ORDER — CEPHALEXIN 250 MG PO CAPS: 500.0000 mg | ORAL_CAPSULE | Freq: Four times a day (QID) | ORAL | Status: DC

## 2018-04-20 MED ORDER — BISACODYL 5 MG PO TBEC: 5.0000 mg | DELAYED_RELEASE_TABLET | Freq: Every day | ORAL | Status: DC | PRN

## 2018-04-20 MED ORDER — ONDANSETRON HCL 4 MG PO TABS: 4.0000 mg | ORAL_TABLET | Freq: Four times a day (QID) | ORAL | Status: DC | PRN

## 2018-04-20 MED ORDER — POTASSIUM CHLORIDE 10 MEQ/100ML IV SOLN
10.0000 meq | Freq: Once | INTRAVENOUS | Status: AC
  Administered 2018-04-20: 10 meq via INTRAVENOUS
  Filled 2018-04-20: qty 100

## 2018-04-20 MED ORDER — ALPRAZOLAM 0.5 MG PO TABS
0.5000 mg | ORAL_TABLET | Freq: Three times a day (TID) | ORAL | Status: DC | PRN
  Administered 2018-04-20: 0.5 mg via ORAL
  Filled 2018-04-20: qty 1

## 2018-04-20 MED ORDER — DIPHENHYDRAMINE HCL 25 MG PO CAPS
25.0000 mg | ORAL_CAPSULE | Freq: Every evening | ORAL | Status: DC | PRN
  Administered 2018-04-20 – 2018-04-22 (×3): 25 mg via ORAL
  Filled 2018-04-20 (×3): qty 1

## 2018-04-20 MED ORDER — INSULIN ASPART 100 UNIT/ML ~~LOC~~ SOLN
0.0000 [IU] | Freq: Three times a day (TID) | SUBCUTANEOUS | Status: DC
  Administered 2018-04-20 – 2018-04-22 (×3): 2 [IU] via SUBCUTANEOUS
  Administered 2018-04-22 – 2018-04-23 (×2): 1 [IU] via SUBCUTANEOUS
  Administered 2018-04-23 – 2018-04-24 (×2): 2 [IU] via SUBCUTANEOUS

## 2018-04-20 MED ORDER — CALCIUM CARBONATE 1500 (600 CA) MG PO TABS
600.0000 mg | ORAL_TABLET | Freq: Every day | ORAL | Status: DC
  Filled 2018-04-20: qty 1

## 2018-04-20 NOTE — Evaluation (Signed)
Physical Therapy Evaluation Patient Details Name: Stephanie Gould MRN: [id number] DOB: [birth year] Today's Date: 04/20/2018   History of Present Illness  Patient is a 77 y/o female presenting to the ED on 04/20/18 with acute encephalopathy. PMH significant for HTN, DM. CT head with No evidence of acute intracranial abnormality.     Clinical Impression  Ms. Stephanie Gould is a pleasant 77 y/o admitted with the above listed diagnosis. Patients family present to establish PLOF, as patient is a poor historian. Per family, grandchildren have been assisting with meals, but otherwise patient has been independent with mobility. Patient today requiring up to Min A for transfers and mobility with patient demonstrating very poor safety awareness requiring consistent cueing for safety/surrounding awareness. Will recommend SNF at discharge to progress safe and independent functional mobility. PT to follow acutely.     Follow Up Recommendations SNF;Supervision/Assistance - 24 hour    Equipment Recommendations  Other (comment)(TBD)    Recommendations for Other Services       Precautions / Restrictions Precautions Precautions: Fall Restrictions Weight Bearing Restrictions: No      Mobility  Bed Mobility Overal bed mobility: Needs Assistance Bed Mobility: Supine to Sit     Supine to sit: Min guard     General bed mobility comments: for safety and line management  Transfers Overall transfer level: Needs assistance Equipment used: 1 person hand held assist Transfers: Sit to/from Stand Sit to Stand: Min guard         General transfer comment: for safety and immediate standing balance  Ambulation/Gait Ambulation/Gait assistance: Min assist Gait Distance (Feet): (hallway) Assistive device: 1 person hand held assist Gait Pattern/deviations: Step-to pattern;Step-through pattern;Decreased stride length;Drifts right/left;Trunk flexed Gait velocity: decreased   General Gait Details: unsteady gait  pattern; requires verbal cueing for safety and surrounding awareness; reaches for items in hallway  Stairs            Wheelchair Mobility    Modified Rankin (Stroke Patients Only)       Balance Overall balance assessment: Needs assistance Sitting-balance support: No upper extremity supported;Feet supported Sitting balance-Leahy Scale: Good     Standing balance support: Single extremity supported;During functional activity Standing balance-Leahy Scale: Poor Standing balance comment: reliant on external support                             Pertinent Vitals/Pain Pain Assessment: No/denies pain    Home Living Family/patient expects to be discharged to:: Private residence Living Arrangements: Alone Available Help at Discharge: Family;Available PRN/intermittently Type of Home: House Home Access: Stairs to enter Entrance Stairs-Rails: None Entrance Stairs-Number of Steps: 3 Home Layout: One level Home Equipment: None      Prior Function Level of Independence: Independent         Comments: family reports they would be present at meal times to make sure she ate, but otherwise patient was alone and seemingly independent      Hand Dominance        Extremity/Trunk Assessment        Lower Extremity Assessment Lower Extremity Assessment: Generalized weakness    Cervical / Trunk Assessment Cervical / Trunk Assessment: Normal  Communication   Communication: No difficulties  Cognition Arousal/Alertness: Awake/alert Behavior During Therapy: WFL for tasks assessed/performed Overall Cognitive Status: Impaired/Different from baseline Area of Impairment: Orientation;Following commands;Safety/judgement;Problem solving                 Orientation Level: Disoriented to;Place;Time;Situation  Following Commands: Follows one step commands with increased time Safety/Judgement: Decreased awareness of safety;Decreased awareness of deficits    Problem Solving: Slow processing;Decreased initiation;Difficulty sequencing;Requires verbal cues;Requires tactile cues General Comments: per family, patient has been confused for ~1 year, recently has begun wandering      General Comments      Exercises     Assessment/Plan    PT Assessment Patient needs continued PT services  PT Problem List Decreased strength;Decreased activity tolerance;Decreased balance;Decreased mobility;Decreased knowledge of use of DME;Decreased safety awareness       PT Treatment Interventions DME instruction;Gait training;Stair training;Functional mobility training;Therapeutic activities;Therapeutic exercise;Balance training;Cognitive remediation    PT Goals (Current goals can be found in the Care Plan section)  Acute Rehab PT Goals Patient Stated Goal: none stated PT Goal Formulation: With patient Time For Goal Achievement: 05/04/18 Potential to Achieve Goals: Good    Frequency Min 2X/week   Barriers to discharge        Co-evaluation               AM-PAC PT "6 Clicks" Daily Activity  Outcome Measure Difficulty turning over in bed (including adjusting bedclothes, sheets and blankets)?: A Little Difficulty moving from lying on back to sitting on the side of the bed? : A Little Difficulty sitting down on and standing up from a chair with arms (e.g., wheelchair, bedside commode, etc,.)?: Unable Help needed moving to and from a bed to chair (including a wheelchair)?: A Little Help needed walking in hospital room?: A Little Help needed climbing 3-5 steps with a railing? : A Lot 6 Click Score: 15    End of Session Equipment Utilized During Treatment: Gait belt Activity Tolerance: Patient tolerated treatment well Patient left: in bed;with call bell/phone within reach;with bed alarm set;with family/visitor present Nurse Communication: Mobility status PT Visit Diagnosis: Other abnormalities of gait and mobility (R26.89);Unsteadiness on feet  (R26.81);Muscle weakness (generalized) (M62.81)    Time: [id number] PT Time Calculation (min) (ACUTE ONLY): 25 min   Charges:   PT Evaluation $PT Eval Moderate Complexity: 1 Mod PT Treatments $Gait Training: 8-22 mins        Kipp LaurenceStephanie R Aaron, PT, DPT 04/20/18 2:05 PM Pager: [phone]

## 2018-04-20 NOTE — Progress Notes (Deleted)
CRITICAL VALUE STICKER  CRITICAL VALUE: BG [phone]   RECEIVER (on-site recipient of call):   DATE & TIME NOTIFIED: 04/20/18 2030  MESSENGER (representative from lab):   MD NOTIFIED: No  TIME OF NOTIFICATION:  RESPONSE:  Treated per protocol.

## 2018-04-20 NOTE — Progress Notes (Signed)
CRITICAL VALUE STICKER  CRITICAL VALUE: Results for Calton DachLINDSAY, Stephanie Gould (MRN [id number]) as of 04/20/2018 20:23  Ref. Range 04/20/2018 18:37  Troponin I Latest Ref Range: <0.03 ng/mL 0.24 (HH)    RECEIVER (on-site recipient of call): Stephanie Sciara. Margree Gimbel, RN  DATE & TIME NOTIFIED: 2020  MESSENGER (representative from lab): Zollie BeckersWalter  MD NOTIFIED: Donnamarie PoagK. Kirby  TIME OF NOTIFICATION: 2028  RESPONSE: Txt paged.
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
## 2018-04-20 NOTE — ED Notes (Signed)
Family at bedside. 

## 2018-04-20 NOTE — H&P (Signed)
History and Physical    Stephanie DachCeola Donati DDU:[id number] DOB: August 02, 1941 DOA: 04/20/2018  PCP: Gwenyth Benderean, Eric L, MD Patient coming from: home  Chief Complaint: acute encephalopathy  HPI: Stephanie Gould is a 77 y.o. female with medical history significant for diabetes, hypertension, presents to the emergency department via EMS G complaint acute encephalopathy. Initial evaluation reveals hypokalemia. [facility] are asked to admit  Information is obtained from the chart and patient noting that information from patient unreliable due to encephalopathy. Reportedly patient was found walking down the street around 5 AM. EMS was called and they approached her. Reportedly she was not responding appropriately code Weston's and commands. Reportedly she had bags packed with household items and clothing. She had no shoes on. She was alert and oriented to self only. Patient denies headache dizziness syncope or near-syncope. She denies chest pain palpitation shortness of breath lower extremity edema. She denies any abdominal pain nausea vomiting diarrhea constipation melena bright red blood per rectum.  Chart review indicates patient has a history of noncompliance with medications.  ED Course: emergency department she's afebrile hemodynamically stable and not hypoxic. She is provided with IV fluids and potassium supplement  Review of Systems: As per HPI otherwise all other systems reviewed and are negative.   Ambulatory Status: ambulates independently  Past Medical History:  Diagnosis Date  . Acute encephalopathy   . Allergic rhinosinusitis   . Diabetes mellitus   . Elevated troponin   . Hypertension   . Hypokalemia   . Obesity     Past Surgical History:  Procedure Laterality Date  . FOOT SURGERY      Social History   Socioeconomic History  . Marital status: Divorced    Spouse name: Not on file  . Number of children: Not on file  . Years of education: Not on file  . Highest education  level: Not on file  Occupational History  . Not on file  Social Needs  . Financial resource strain: Not on file  . Food insecurity:    Worry: Not on file    Inability: Not on file  . Transportation needs:    Medical: Not on file    Non-medical: Not on file  Tobacco Use  . Smoking status: Never Smoker  . Smokeless tobacco: Never Used  Substance and Sexual Activity  . Alcohol use: No  . Drug use: No  . Sexual activity: Not on file  Lifestyle  . Physical activity:    Days per week: Not on file    Minutes per session: Not on file  . Stress: Not on file  Relationships  . Social connections:    Talks on phone: Not on file    Gets together: Not on file    Attends religious service: Not on file    Active member of club or organization: Not on file    Attends meetings of clubs or organizations: Not on file    Relationship status: Not on file  . Intimate partner violence:    Fear of current or ex partner: Not on file    Emotionally abused: Not on file    Physically abused: Not on file    Forced sexual activity: Not on file  Other Topics Concern  . Not on file  Social History Narrative  . Not on file    No Known Allergies  No family history on file.  Prior to Admission medications   Medication Sig Start Date End Date Taking? Authorizing Provider  amLODipine-benazepril (LOTREL)  10-20 MG per capsule Take 1 capsule by mouth daily.    [provider]  Calcium Carbonate (CALTRATE 600 PO) Take by mouth.    [provider]  cephALEXin (KEFLEX) 500 MG capsule Take 1 capsule (500 mg total) by mouth 4 (four) times daily. 03/07/18   Derwood Kaplan, MD  fish oil-omega-3 fatty acids 1000 MG capsule Take 1 g by mouth daily.    [provider]  fluticasone (VERAMYST) 27.5 MCG/SPRAY nasal spray Place 2 sprays into the nose daily as needed.    [provider]  OVER THE COUNTER MEDICATION Take 1 tablet by mouth daily as needed. For sinuses    [provider]  POTASSIUM PO Take 1 tablet by mouth daily.    [provider]  Travoprost, BAK Free, (TRAVATAN) 0.004 % SOLN ophthalmic solution Place 1 drop into both eyes at bedtime.    [provider]  VITAMIN E PO Take 1 capsule by mouth daily.    [provider]    Physical Exam: Vitals:   04/20/18 0930 04/20/18 1000 04/20/18 1030 04/20/18 1100  BP: (!) 143/70 139/89 129/69 (!) 158/85  Pulse: 64 66 66 80  Resp: 18 15 20 11   Temp:      TempSrc:      SpO2: 100% 100% 100% 100%     General:  Appears calm and comfortable sitting up in bed Eyes:  PERRL, EOMI, normal lids, iris ENT:  grossly normal hearing, lips & tongue, mucous membranes of her mouth are pink but slightly dry Neck:  no LAD, masses or thyromegaly Cardiovascular:  RRR, no m/r/g. No LE edema.  Respiratory:  CTA bilaterally, no w/r/r. Normal respiratory effort. Abdomen:  soft, positive bowel sounds throughout no guarding or rebounding Skin:  no rash or induration seen on limited exam Musculoskeletal:  grossly normal tone BUE/BLE, good ROM, no bony abnormality Psychiatric:  grossly normal mood and affect, speech fluent and appropriate, AOx3 Neurologic:  Alert oriented to self only bilateral grip 5 out of 5 speech clear facial symmetry tongue midline Jeanella Craze to be word searching  Labs on Admission: I have personally reviewed following labs and imaging studies  CBC: Recent Labs  Lab 04/20/18 0632 04/20/18 0642  WBC 7.2  --   NEUTROABS 5.5  --   HGB 11.5* 12.2  HCT 35.6* 36.0  MCV 94.9  --   PLT 226  --    Basic Metabolic Panel: Recent Labs  Lab 04/20/18 0632 04/20/18 0642  NA 143 145  K 2.9* 3.0*  CL 108 107  CO2 25  --   GLUCOSE 146* 138*  BUN 24* 29*  CREATININE 1.10* 0.90  CALCIUM 9.1  --    GFR: CrCl cannot be calculated (Unknown ideal weight.). Liver Function Tests: Recent Labs  Lab 04/20/18 0632  AST 43*  ALT 22  ALKPHOS 58  BILITOT 1.0  PROT 7.2  ALBUMIN  3.9   No results for input(s): LIPASE, AMYLASE in the last 168 hours. No results for input(s): AMMONIA in the last 168 hours. Coagulation Profile: No results for input(s): INR, PROTIME in the last 168 hours. Cardiac Enzymes: No results for input(s): CKTOTAL, CKMB, CKMBINDEX, TROPONINI in the last 168 hours. BNP (last 3 results) No results for input(s): PROBNP in the last 8760 hours. HbA1C: No results for input(s): HGBA1C in the last 72 hours. CBG: No results for input(s): GLUCAP in the last 168 hours. Lipid Profile: No results for input(s): CHOL, HDL, LDLCALC, TRIG, CHOLHDL,  LDLDIRECT in the last 72 hours. Thyroid Function Tests: No results for input(s): TSH, T4TOTAL, FREET4, T3FREE, THYROIDAB in the last 72 hours. Anemia Panel: No results for input(s): VITAMINB12, FOLATE, FERRITIN, TIBC, IRON, RETICCTPCT in the last 72 hours. Urine analysis:    Component Value Date/Time   COLORURINE YELLOW 04/20/2018 0608   APPEARANCEUR HAZY (A) 04/20/2018 0608   LABSPEC 1.024 04/20/2018 0608   PHURINE 5.0 04/20/2018 0608   GLUCOSEU NEGATIVE 04/20/2018 0608   HGBUR SMALL (A) 04/20/2018 0608   BILIRUBINUR NEGATIVE 04/20/2018 0608   KETONESUR 5 (A) 04/20/2018 0608   PROTEINUR 30 (A) 04/20/2018 0608   NITRITE NEGATIVE 04/20/2018 0608   LEUKOCYTESUR TRACE (A) 04/20/2018 0608    Creatinine Clearance: CrCl cannot be calculated (Unknown ideal weight.).  Sepsis Labs: @LABRCNTIP (procalcitonin:4,lacticidven:4) )No results found for this or any previous visit (from the past 240 hour(s)).   Radiological Exams on Admission: Ct Head Wo Contrast  Result Date: 04/20/2018 CLINICAL DATA:  77 year old female with altered mental status. EXAM: CT HEAD WITHOUT CONTRAST TECHNIQUE: Contiguous axial images were obtained from the base of the skull through the vertex without intravenous contrast. COMPARISON:  11/08/2011 and prior head CTs FINDINGS: Brain: No evidence of acute infarction, hemorrhage,  hydrocephalus, extra-axial collection or mass lesion/mass effect. Atrophy and chronic small-vessel white matter ischemic changes again noted. Vascular: Carotid atherosclerotic calcifications noted. Skull: Normal. Negative for fracture or focal lesion. Sinuses/Orbits: No acute finding. Other: None. IMPRESSION: 1. No evidence of acute intracranial abnormality 2. Atrophy and chronic small-vessel white matter ischemic changes. Electronically Signed   By: Harmon PierJeffrey  Hu M.D.   On: 04/20/2018 07:33   Dg Chest Port 1 View  Result Date: 04/20/2018 CLINICAL DATA:  Altered mental status. EXAM: PORTABLE CHEST 1 VIEW COMPARISON:  11/08/2011 FINDINGS: Shallow inspiration. Normal heart size and pulmonary vascularity. No focal airspace disease or consolidation in the lungs. No blunting of costophrenic angles. No pneumothorax. Mediastinal contours appear intact. IMPRESSION: No active disease. Electronically Signed   By: Burman NievesWilliam  Stevens M.D.   On: 04/20/2018 06:36    EKG: Independently reviewed. Sinus rhythm Probable left atrial enlargement Prolonged QT interval  Assessment/Plan Principal Problem:   Acute encephalopathy Active Problems:   Hypokalemia   Hypertension   Diabetes mellitus (HCC)   Elevated troponin   #1. Acute encephalopathy. Family reports long hx dementia that is progressing.  She is afebrile hemodynamically stable and not hypoxic. No signs of infection. Urine drug screen negative. Her potassium 2.9 otherwise no metabolic derangements. She does have an elevated troponin. EKG as noted above. CT of the head without acute abnormality. Neuro exam mostly benign but she does seem to be word searching -admit to telemetry -Obtain B-12 folate level -Hold altering medications -social work for placement -Monitor  #2. Elevated troponin. Patient denies chest pain. EKG as noted above. Pt denies cp.  -Cycle troponin -Serial EKG -OP follow up  3. Diabetes. Serum glucose 138 -obtain hemoglobin A1c -Riding  scale insulin for optimal control  #4. Hypokalemia. Mild. Likely related decreased oral intake -Replete -Recheck   DVT prophylaxis: lovenox  Code Status: full  Family Communication: none at bedside  Disposition Plan: to be determined  Consults called: none  Admission status: inpatient    Gwenyth BenderBLACK,Aivah Putman M MD [facility]  If 7PM-7AM, please contact night-coverage [email or web address] Password [email or web address]  04/20/2018, 11:28 AM
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
## 2018-04-20 NOTE — ED Triage Notes (Signed)
Pt was found walking down the street at 5am in the morning, she was not making sense w/ her responses to questions. She had her "bags packed w/ odd and ends.  She did not have any shoes on, her care giver was not home.  Alert to name and place.

## 2018-04-21 DIAGNOSIS — I10 Essential (primary) hypertension: Secondary | ICD-10-CM

## 2018-04-21 LAB — TROPONIN I
Troponin I: 0.13 ng/mL (ref <0.03)
Troponin I: 0.19 ng/mL (ref <0.03)

## 2018-04-21 LAB — CBC
HEMATOCRIT: 32 % — AB (ref 36.0–46.0)
HEMOGLOBIN: 10.5 g/dL — AB (ref 12.0–15.0)
MCH: 31 pg (ref 26.0–34.0)
MCHC: 32.8 g/dL (ref 30.0–36.0)
MCV: 94.4 fL (ref 78.0–100.0)
Platelets: 224 10*3/uL (ref 150–400)
RBC: 3.39 MIL/uL — AB (ref 3.87–5.11)
RDW: 13.9 % (ref 11.5–15.5)
WBC: 4.3 10*3/uL (ref 4.0–10.5)

## 2018-04-21 LAB — BASIC METABOLIC PANEL
ANION GAP: 8 (ref 5–15)
BUN: 18 mg/dL (ref 8–23)
CALCIUM: 8.7 mg/dL — AB (ref 8.9–10.3)
CHLORIDE: 110 mmol/L (ref 98–111)
CO2: 26 mmol/L (ref 22–32)
Creatinine, Ser: 0.9 mg/dL (ref 0.44–1.00)
GFR calc non Af Amer: 60 mL/min — ABNORMAL LOW (ref >60)
Glucose, Bld: 109 mg/dL — ABNORMAL HIGH (ref 70–99)
POTASSIUM: 3.8 mmol/L (ref 3.5–5.1)
Sodium: 144 mmol/L (ref 135–145)

## 2018-04-21 LAB — GLUCOSE, CAPILLARY
GLUCOSE-CAPILLARY: 96 mg/dL (ref 70–99)
GLUCOSE-CAPILLARY: 168 mg/dL — AB (ref 70–99)
GLUCOSE-CAPILLARY: 140 mg/dL — AB (ref 70–99)
Glucose-Capillary: 93 mg/dL (ref 70–99)

## 2018-04-21 LAB — TSH: TSH: 4.642 u[IU]/mL — AB (ref 0.350–4.500)

## 2018-04-21 LAB — AMMONIA: Ammonia: 15 umol/L (ref 9–35)

## 2018-04-21 LAB — HEMOGLOBIN A1C
Hgb A1c MFr Bld: 9.2 % — ABNORMAL HIGH (ref 4.8–5.6)
Mean Plasma Glucose: 217.34 mg/dL

## 2018-04-21 MED ORDER — HALOPERIDOL LACTATE 5 MG/ML IJ SOLN
2.0000 mg | Freq: Once | INTRAMUSCULAR | Status: AC
  Administered 2018-04-21: 2 mg via INTRAVENOUS
  Filled 2018-04-21: qty 1

## 2018-04-21 NOTE — Clinical Social Work Note (Signed)
Clinical Social Work Assessment  Patient Details  Name: Stephanie Gould MRN: [id number] Date of Birth: 1940-09-28  Date of referral:  04/21/18               Reason for consult:  Facility Placement                Permission sought to share information with:  Facility Sport and exercise psychologist, Family Supports Permission granted to share information::  Yes, Verbal Permission Granted  Name::     Midwife::  SNF  Relationship::  Daughter  Contact Information:     Housing/Transportation Living arrangements for the past 2 months:  [place] of Information:  Medical Team, Adult Children Patient Interpreter Needed:  None Criminal Activity/Legal Involvement Pertinent to Current Situation/Hospitalization:  No - Comment as needed Significant Relationships:  Adult Children Lives with:  Self, Adult Children Do you feel safe going back to the place where you live?  Yes Need for family participation in patient care:  Yes (Comment)  Care giving concerns:  Patient from home alone but will need placement at discharge.    Social Worker assessment / plan:  CSW alerted by team that family is interested in looking at placement. CSW faxed out referral, met briefly with patient's daughter to provide bed offers (patient's daughter was sick during discussion). CSW informed patient's daughter to review list and CSW would check back in with her on choices.  Employment status:  Retired Forensic scientist:  Medicare PT Recommendations:  [place] / Referral to community resources:  [place]  Patient/Family's Response to care:  Patient's family is agreeable to looking into SNF placement.  Patient/Family's Understanding of and Emotional Response to Diagnosis, Current Treatment, and Prognosis:  Patient's daughter had vomited prior to CSW entering the room, and was visibly shaking during discussion. Patient's daughter assured CSW that she felt fine,  but did not appear to look fine during discussion. Patient's daughter agreed to review list and to follow up later.  Emotional Assessment Appearance:  Appears stated age Attitude/Demeanor/Rapport:  Unable to Assess Affect (typically observed):  Unable to Assess Orientation:  Oriented to Self Alcohol / Substance use:  Not Applicable Psych involvement (Current and /or in the community):  No (Comment)  Discharge Needs  Concerns to be addressed:  Care Coordination Readmission within the last 30 days:  No Current discharge risk:  Cognitively Impaired, Dependent with Mobility, Physical Impairment, Lives alone Barriers to Discharge:  Continued Medical Work up, Ship broker, Requiring sitter/restraints   [name], [place] 04/21/2018, 3:49 PM

## 2018-04-21 NOTE — Progress Notes (Signed)
   04/21/18 1933  What Happened  Was fall witnessed? Yes  Who witnessed fall? son and sitter  Patients activity before fall ambulating-assisted;bathroom-assisted  Point of contact buttocks  Was patient injured? No  Follow Up  MD notified Donnamarie PoagK. Kirby  Time MD notified [phone]  Family notified Yes-comment (Son at bedside)  Time family notified 1930  Additional tests No  Simple treatment Other (comment) (Assessed.)  Progress note created (see row info) Yes  Adult Fall Risk Assessment  Risk Factor Category (scoring not indicated) Fall has occurred during this admission (document High fall risk)  Patient's Fall Risk High Fall Risk (>13 points)  Adult Fall Risk Interventions  Required Bundle Interventions *See Row Information* High fall risk - low, moderate, and high requirements implemented  Additional Interventions Safety Sitter/Safety Rounder;Family Supervision  Screening for Fall Injury Risk (To be completed on HIGH fall risk patients) - Assessing Need for Low Bed  Risk For Fall Injury- Low Bed Criteria None identified - Continue screening  Screening for Fall Injury Risk (To be completed on HIGH fall risk patients who do not meet crieteria for Low Bed) - Assessing Need for Floor Mats Only  Risk For Fall Injury- Criteria for Floor Mats Confusion/dementia (+CAM, CIWA, TBI, etc.)  Will Implement Floor Mats Yes   Reported: Pt was agitated and ready to leave. The son was trying to help and keep her in the room. She missed the chair and was on the floor. Pt got back up and was not hurt. No injuries noted. Paged MD Craige CottaKirby. Orders for bilateral restraints and Haldol. Security called to help assist Pt back to the bed.

## 2018-04-21 NOTE — Progress Notes (Signed)
[facility] PROGRESS NOTE  Stephanie Gould ZOX:[id number] DOB: [birth year] DOA: 04/20/2018   PCP: [name], MD     Assessment/Plan: Principal Problem:   Acute encephalopathy Active Problems:   Hypertension   Diabetes mellitus (HCC)   Hypokalemia   Elevated troponin   77 y.o. female with medical history significant for diabetes, hypertension, presents to the emergency department via EMS G complaint acute encephalopathy  Versus progressive dementia. Initial evaluation reveals hypokalemia  Assessment and plan #1. Acute encephalopathy vs Undiagnosed vascular dementia. Family reports long hx dementia that is progressing.  She is afebrile hemodynamically stable and not hypoxic. No signs of infection. Urine drug screen negative. Her potassium 2.9 otherwise no metabolic derangements. She does have an elevated troponin 0.24>0.19>0.13.does not report any chest pain. 2-D echo to rule out wall motion abnormalities.telemetry shows normal sinus rhythm.  . CT of the head without acute abnormality. Neuro exam mostly benign but she does seem to be word searching -Obtain B-12 , RPR -Hold  olanzapine -social work for placement  CT without any acute intracranial abnormality, chronic small vessel white matter disease Chest x-ray negative Check ammonia level, TSH, free T4, HIV, RPR, calcium okay   #2. Elevated troponin. Patient denies chest pain. EKG as noted above. Pt denies cp.  She does have an elevated troponin 0.24>0.19>0.13.does not report any chest pain. 2-D echo to rule out wall motion abnormalities.telemetry shows normal sinus rhythm.  3. Diabetes. Serum glucose 138 -obtain hemoglobin A1c SSI, hold glipizide  #4. Hypokalemia.  repleted. Likely related decreased oral intake   #   DVT prophylaxsis lovenox   Code Status:  Full code    Family Communication: Discussed in detail with the patient, all imaging results, lab results explained to the patient   Disposition  Plan:   Anticipate discharge in 1-2 days after workup is complete. Discussed with the son who was at bedside       Consultants:   none   Procedures:  None   Antibiotics: Anti-infectives (From admission, onward)   Start     Dose/Rate Route Frequency Ordered Stop   04/20/18 1145  cephALEXin (KEFLEX) capsule 500 mg  Status:  Discontinued     500 mg Oral 4 times daily 04/20/18 1143 04/20/18 1439         HPI/Subjective: Sitter in the room Patient is completely delusional, confabulating stories, son has noticed steady decline since one year Objective: Vitals:   04/20/18 2305 04/21/18 0305 04/21/18 0800 04/21/18 1216  BP: (!) 144/73 139/76 (!) 99/53 (!) 151/78  Pulse: 79 83 68 66  Resp: 18 18 16 16   Temp: 98.3 F (36.8 C) 98 F (36.7 C) 97.9 F (36.6 C) 97.6 F (36.4 C)  TempSrc: Oral Oral Oral Oral  SpO2: 100% 100% 99% 100%    Intake/Output Summary (Last 24 hours) at 04/21/2018 1507 Last data filed at 04/21/2018 8119 Gross per 24 hour  Intake 240 ml  Output -  Net 240 ml    Exam:  Examination:  General exam: Appears calm and comfortable  Respiratory system: Clear to auscultation. Respiratory effort normal. Cardiovascular system: S1 & S2 heard, RRR. No JVD, murmurs, rubs, gallops or clicks. No pedal edema. Gastrointestinal system: Abdomen is nondistended, soft and nontender. No organomegaly or masses felt. Normal bowel sounds heard. Central nervous system: disoriented. No focal neurological deficits. Extremities: Symmetric 5 x 5 power. Skin: No rashes, lesions or ulcers Psychiatry: Judgement impaired    Data Reviewed: I have  personally reviewed following labs and imaging studies  Micro Results No results found for this or any previous visit (from the past 240 hour(s)).  Radiology Reports Ct Head Wo Contrast  Result Date: 04/20/2018 CLINICAL DATA:  77 year old female with altered mental status. EXAM: CT HEAD WITHOUT CONTRAST TECHNIQUE: Contiguous axial  images were obtained from the base of the skull through the vertex without intravenous contrast. COMPARISON:  11/08/2011 and prior head CTs FINDINGS: Brain: No evidence of acute infarction, hemorrhage, hydrocephalus, extra-axial collection or mass lesion/mass effect. Atrophy and chronic small-vessel white matter ischemic changes again noted. Vascular: Carotid atherosclerotic calcifications noted. Skull: Normal. Negative for fracture or focal lesion. Sinuses/Orbits: No acute finding. Other: None. IMPRESSION: 1. No evidence of acute intracranial abnormality 2. Atrophy and chronic small-vessel white matter ischemic changes. Electronically Signed   By: Harmon PierJeffrey  Hu M.D.   On: 04/20/2018 07:33   Dg Chest Port 1 View  Result Date: 04/20/2018 CLINICAL DATA:  Altered mental status. EXAM: PORTABLE CHEST 1 VIEW COMPARISON:  11/08/2011 FINDINGS: Shallow inspiration. Normal heart size and pulmonary vascularity. No focal airspace disease or consolidation in the lungs. No blunting of costophrenic angles. No pneumothorax. Mediastinal contours appear intact. IMPRESSION: No active disease. Electronically Signed   By: Burman NievesWilliam  Stevens M.D.   On: 04/20/2018 06:36     CBC Recent Labs  Lab 04/20/18 [id number] 04/20/18 0642 04/21/18 0554  WBC 7.2  --  4.3  HGB 11.5* 12.2 10.5*  HCT 35.6* 36.0 32.0*  PLT 226  --  224  MCV 94.9  --  94.4  MCH 30.7  --  31.0  MCHC 32.3  --  32.8  RDW 13.6  --  13.9  LYMPHSABS 1.0  --   --   MONOABS 0.6  --   --   EOSABS 0.0  --   --   BASOSABS 0.0  --   --     Chemistries  Recent Labs  Lab 04/20/18 0632 04/20/18 0642 04/21/18 0554  NA 143 145 144  K 2.9* 3.0* 3.8  CL 108 107 110  CO2 25  --  26  GLUCOSE 146* 138* 109*  BUN 24* 29* 18  CREATININE 1.10* 0.90 0.90  CALCIUM 9.1  --  8.7*  AST 43*  --   --   ALT 22  --   --   ALKPHOS 58  --   --   BILITOT 1.0  --   --     ------------------------------------------------------------------------------------------------------------------ CrCl cannot be calculated (Unknown ideal weight.). ------------------------------------------------------------------------------------------------------------------ No results for input(s): HGBA1C in the last 72 hours. ------------------------------------------------------------------------------------------------------------------ No results for input(s): CHOL, HDL, LDLCALC, TRIG, CHOLHDL, LDLDIRECT in the last 72 hours. ------------------------------------------------------------------------------------------------------------------ No results for input(s): TSH, T4TOTAL, T3FREE, THYROIDAB in the last 72 hours.  Invalid input(s): FREET3 ------------------------------------------------------------------------------------------------------------------ No results for input(s): VITAMINB12, FOLATE, FERRITIN, TIBC, IRON, RETICCTPCT in the last 72 hours.  Coagulation profile No results for input(s): INR, PROTIME in the last 168 hours.  No results for input(s): DDIMER in the last 72 hours.  Cardiac Enzymes Recent Labs  Lab 04/20/18 1837 04/20/18 2355 04/21/18 0554  TROPONINI 0.24* 0.19* 0.13*   ------------------------------------------------------------------------------------------------------------------ Invalid input(s): POCBNP   CBG: Recent Labs  Lab 04/20/18 1247 04/20/18 1646 04/20/18 2117 04/21/18 0602 04/21/18 1139  GLUCAP 69* 171* 130* 93 96       Studies: Ct Head Wo Contrast  Result Date: 04/20/2018 CLINICAL DATA:  77 year old female with altered mental status. EXAM: CT HEAD WITHOUT CONTRAST TECHNIQUE: Contiguous axial images were obtained  from the base of the skull through the vertex without intravenous contrast. COMPARISON:  11/08/2011 and prior head CTs FINDINGS: Brain: No evidence of acute infarction, hemorrhage, hydrocephalus, extra-axial  collection or mass lesion/mass effect. Atrophy and chronic small-vessel white matter ischemic changes again noted. Vascular: Carotid atherosclerotic calcifications noted. Skull: Normal. Negative for fracture or focal lesion. Sinuses/Orbits: No acute finding. Other: None. IMPRESSION: 1. No evidence of acute intracranial abnormality 2. Atrophy and chronic small-vessel white matter ischemic changes. Electronically Signed   By: Harmon PierJeffrey  Hu M.D.   On: 04/20/2018 07:33   Dg Chest Port 1 View  Result Date: 04/20/2018 CLINICAL DATA:  Altered mental status. EXAM: PORTABLE CHEST 1 VIEW COMPARISON:  11/08/2011 FINDINGS: Shallow inspiration. Normal heart size and pulmonary vascularity. No focal airspace disease or consolidation in the lungs. No blunting of costophrenic angles. No pneumothorax. Mediastinal contours appear intact. IMPRESSION: No active disease. Electronically Signed   By: Burman NievesWilliam  Stevens M.D.   On: 04/20/2018 06:36      Lab Results  Component Value Date   HGBA1C 6.4 (H) 11/08/2011   Lab Results  Component Value Date   CREATININE 0.90 04/21/2018       Scheduled Meds: . calcium carbonate  1 tablet Oral Q breakfast  . enoxaparin (LOVENOX) injection  40 mg Subcutaneous Q24H  . insulin aspart  0-5 Units Subcutaneous QHS  . insulin aspart  0-9 Units Subcutaneous TID WC  . latanoprost  1 drop Both Eyes QHS   Continuous Infusions:   LOS: 1 day    Time spent: >30 MINS    Richarda OverlieNayana Malya Cirillo  [facility] Pager [phone]. If 7PM-7AM, please contact night-coverage at [email or web address], password [email or web address] 04/21/2018, 3:07 PM  LOS: 1 day

## 2018-04-22 ENCOUNTER — Inpatient Hospital Stay (HOSPITAL_COMMUNITY): Payer: Medicare Other

## 2018-04-22 DIAGNOSIS — I5030 Unspecified diastolic (congestive) heart failure: Secondary | ICD-10-CM

## 2018-04-22 DIAGNOSIS — G9340 Encephalopathy, unspecified: Principal | ICD-10-CM

## 2018-04-22 LAB — COMPREHENSIVE METABOLIC PANEL
ALK PHOS: 55 U/L (ref 38–126)
ALT: 18 U/L (ref 0–44)
AST: 23 U/L (ref 15–41)
Albumin: 3.6 g/dL (ref 3.5–5.0)
Anion gap: 11 (ref 5–15)
BUN: 17 mg/dL (ref 8–23)
CALCIUM: 9.2 mg/dL (ref 8.9–10.3)
CO2: 22 mmol/L (ref 22–32)
Chloride: 108 mmol/L (ref 98–111)
Creatinine, Ser: 0.8 mg/dL (ref 0.44–1.00)
GFR calc non Af Amer: >60 mL/min (ref >60)
Glucose, Bld: 128 mg/dL — ABNORMAL HIGH (ref 70–99)
Potassium: 3.9 mmol/L (ref 3.5–5.1)
SODIUM: 141 mmol/L (ref 135–145)
Total Bilirubin: 0.7 mg/dL (ref 0.3–1.2)
Total Protein: 6.9 g/dL (ref 6.5–8.1)

## 2018-04-22 LAB — ECHOCARDIOGRAM COMPLETE

## 2018-04-22 LAB — RPR: RPR Ser Ql: NONREACTIVE

## 2018-04-22 LAB — GLUCOSE, CAPILLARY
GLUCOSE-CAPILLARY: 106 mg/dL — AB (ref 70–99)
GLUCOSE-CAPILLARY: 156 mg/dL — AB (ref 70–99)
GLUCOSE-CAPILLARY: 121 mg/dL — AB (ref 70–99)
Glucose-Capillary: 159 mg/dL — ABNORMAL HIGH (ref 70–99)

## 2018-04-22 LAB — HIV ANTIBODY (ROUTINE TESTING W REFLEX): HIV Screen 4th Generation wRfx: NONREACTIVE

## 2018-04-22 MED ORDER — OLANZAPINE 2.5 MG PO TABS
10.0000 mg | ORAL_TABLET | Freq: Every evening | ORAL | Status: DC
  Administered 2018-04-22 – 2018-04-23 (×2): 10 mg via ORAL
  Filled 2018-04-22 (×2): qty 4

## 2018-04-22 NOTE — Progress Notes (Signed)
Inpatient Diabetes Program Recommendations  AACE/ADA: New Consensus Statement on Inpatient Glycemic Control (2019)  Target Ranges:  Prepandial:   less than 140 mg/dL      Peak postprandial:   less than 180 mg/dL (1-2 hours)      Critically ill patients:  140 - 180 mg/dL  Results for Calton DachLINDSAY, Crystalle (MRN [id number]) as of 04/22/2018 15:45  Ref. Range 04/21/2018 06:02 04/21/2018 11:39 04/21/2018 16:58 04/21/2018 21:27 04/22/2018 06:26 04/22/2018 11:53  Glucose-Capillary Latest Ref Range: 70 - 99 mg/dL 93 96 409168 (H) 811140 (H) 914106 (H) 156 (H)    Review of Glycemic Control  Diabetes history: DM2 Outpatient Diabetes medications: Glipizide XL 10 mg QAM Current orders for Inpatient glycemic control: Novolog 0-9 units TID with meals, Novolog 0-5 units QHS  Inpatient Diabetes Program Recommendations:  HgbA1C: A1C 9.2% on 04/21/2018 indicating an average glucose of 217 mg/dl over the past 2-3 months. Patient reports that her PCP just recently increased her dose of Glipizide 3 weeks ago. Encouraged patient to check glucose at least BID and to follow up with PCP.  NOTE: Spoke with patient and her daughter about diabetes and home regimen for diabetes control. Patient reports that she is followed by PCP for diabetes management. Patient has a pill box with daily medications for each day. Patient's daughter has pill box with her and showed me the prescription labels and noted one of them was for Glipizide XL 10 mg daily. Patient's daughter reports that patient seen her PCP about 3 weeks ago and that PCP increased the dose of the Glipizide at that time.   Patient's daughter checks her mothers glucose once a day in the morning and she reports that it is usually in the mid 100's mg/dl.  Discussed A1C results (9.2% on 04/21/18) and explained that her current A1C indicates an average glucose of 217 mg/dl over the past 2-3 months. Discussed glucose and A1C goals. Explained to patient that she would need to be on the increased dose  of Glipizide for a full 3 months and have the A1C checked to determine if A1C has improved.  Discussed importance of checking CBGs and maintaining good CBG control to prevent long-term and short-term complications.  Stressed to the patient the importance of improving glycemic control to prevent further complications from uncontrolled diabetes. Discussed impact of nutrition, exercise, stress, sickness, and medications on diabetes control. Encouraged patient's daughter to check her mothers glucose at least 2 times per day which she will need to take to doctor appointments. Encouraged them to reach out to PCP if glucose is consistently over 180 mg/dl. Explained how the doctor she follows up with can use the glucose trends to continue to make adjustments with DM medication.  Patient verbalized understanding of information discussed and they state that they have no further questions at this time related to diabetes.  Thanks, Orlando PennerMarie Chason Mciver, RN, MSN, CDE Diabetes Coordinator Inpatient Diabetes Program [phone] (Team Pager)

## 2018-04-22 NOTE — Progress Notes (Addendum)
[facility] PROGRESS NOTE  Stephanie Gould WUJ:[id number] DOB: 1941-09-07 DOA: 04/20/2018   PCP: Stephanie Bender, MD     Assessment/Plan: Principal Problem:   Acute encephalopathy Active Problems:   Hypertension   Diabetes mellitus (HCC)   Hypokalemia   Elevated troponin   77 y.o. female with medical history significant for diabetes, hypertension, presents to the emergency department via EMS G complaint acute encephalopathy  Versus progressive dementia. Initial evaluation reveals hypokalemia  Assessment and plan #1. Acute encephalopathy vs Undiagnosed vascular dementia. Family reports dementia since April of this year which has rapidly progressed  She is afebrile hemodynamically stable and not hypoxic. No signs of infection. Urine drug screen negative. CT of the head without acute abnormality. Neuro exam mostly benign but she does seem to be word searching -Resumed Zydis as daughter states was placed on this by Dr. [name] and this helps with sundowning -social work for placement  CT without any acute intracranial abnormality, chronic small vessel white matter disease Chest x-ray negative Ammonia normal TSH slightly elevated T4 normal ordered HIV RPR negative Discussed with daughter   #2. Elevated troponin. Patient denies chest pain. EKG as noted above. Pt denies cp.  She does have an elevated troponin 0.24>0.19>0.13.does not report any chest pain. 2-D echo negative for wall motion issues  3. Diabetes. Serum glucose 138 -obtain hemoglobin A1c SSI, hold glipizide  #4. Hypokalemia.  repleted. Likely related decreased oral intake   #   DVT prophylaxsis lovenox   Code Status:  Full code    Family Communication: Discussed in detail with the patient's daughter Disposition Plan:   Anticipate discharge once can be without sitter to a skilled facility   Consultants:   none   Procedures:  None   Antibiotics: Anti-infectives (From admission, onward)   Start      Dose/Rate Route Frequency Ordered Stop   04/20/18 1145  cephALEXin (KEFLEX) capsule 500 mg  Status:  Discontinued     500 mg Oral 4 times daily 04/20/18 1143 04/20/18 1439         HPI/Subjective:  Confused Daughter at bedside Can tell me the place but not much else and confabulating  Objective: Vitals:   04/22/18 0346 04/22/18 0816 04/22/18 1331 04/22/18 1609  BP: 125/65 131/65 134/79 124/68  Pulse: 69 84 64 71  Resp: 16 18 18 18   Temp: (!) 97.4 F (36.3 C) 97.7 F (36.5 C) 98 F (36.7 C) 97.8 F (36.6 C)  TempSrc: Oral Oral Oral Oral  SpO2: 97% 98% 99% 100%   No intake or output data in the 24 hours ending 04/22/18 1725  Exam:  Examination:  EOMI NCAT poor dentition looks younger than stated age Chest clear S1-S2 no murmur Abdomen soft No lower extremity edema Cannot assess psych or neuro    Data Reviewed: I have personally reviewed following labs and imaging studies  Micro Results No results found for this or any previous visit (from the past 240 hour(s)).  Radiology Reports Ct Head Wo Contrast  Result Date: 04/20/2018 CLINICAL DATA:  77 year old female with altered mental status. EXAM: CT HEAD WITHOUT CONTRAST TECHNIQUE: Contiguous axial images were obtained from the base of the skull through the vertex without intravenous contrast. COMPARISON:  11/08/2011 and prior head CTs FINDINGS: Brain: No evidence of acute infarction, hemorrhage, hydrocephalus, extra-axial collection or mass lesion/mass effect. Atrophy and chronic small-vessel white matter ischemic changes again noted. Vascular: Carotid atherosclerotic calcifications noted. Skull: Normal. Negative for fracture or  focal lesion. Sinuses/Orbits: No acute finding. Other: None. IMPRESSION: 1. No evidence of acute intracranial abnormality 2. Atrophy and chronic small-vessel white matter ischemic changes. Electronically Signed   By: Harmon PierJeffrey  Hu M.D.   On: 04/20/2018 07:33   Dg Chest Port 1 View  Result  Date: 04/20/2018 CLINICAL DATA:  Altered mental status. EXAM: PORTABLE CHEST 1 VIEW COMPARISON:  11/08/2011 FINDINGS: Shallow inspiration. Normal heart size and pulmonary vascularity. No focal airspace disease or consolidation in the lungs. No blunting of costophrenic angles. No pneumothorax. Mediastinal contours appear intact. IMPRESSION: No active disease. Electronically Signed   By: Burman NievesWilliam  Stevens M.D.   On: 04/20/2018 06:36     CBC Recent Labs  Lab 04/20/18 [id number] 04/20/18 0642 04/21/18 0554  WBC 7.2  --  4.3  HGB 11.5* 12.2 10.5*  HCT 35.6* 36.0 32.0*  PLT 226  --  224  MCV 94.9  --  94.4  MCH 30.7  --  31.0  MCHC 32.3  --  32.8  RDW 13.6  --  13.9  LYMPHSABS 1.0  --   --   MONOABS 0.6  --   --   EOSABS 0.0  --   --   BASOSABS 0.0  --   --     Chemistries  Recent Labs  Lab 04/20/18 0632 04/20/18 0642 04/21/18 0554 04/22/18 0518  NA 143 145 144 141  K 2.9* 3.0* 3.8 3.9  CL 108 107 110 108  CO2 25  --  26 22  GLUCOSE 146* 138* 109* 128*  BUN 24* 29* 18 17  CREATININE 1.10* 0.90 0.90 0.80  CALCIUM 9.1  --  8.7* 9.2  AST 43*  --   --  23  ALT 22  --   --  18  ALKPHOS 58  --   --  55  BILITOT 1.0  --   --  0.7   ------------------------------------------------------------------------------------------------------------------ CrCl cannot be calculated (Unknown ideal weight.). ------------------------------------------------------------------------------------------------------------------ Recent Labs    04/21/18 1541  HGBA1C 9.2*   ------------------------------------------------------------------------------------------------------------------ No results for input(s): CHOL, HDL, LDLCALC, TRIG, CHOLHDL, LDLDIRECT in the last 72 hours. ------------------------------------------------------------------------------------------------------------------ Recent Labs    04/21/18 1547  TSH 4.642*    ------------------------------------------------------------------------------------------------------------------ No results for input(s): VITAMINB12, FOLATE, FERRITIN, TIBC, IRON, RETICCTPCT in the last 72 hours.  Coagulation profile No results for input(s): INR, PROTIME in the last 168 hours.  No results for input(s): DDIMER in the last 72 hours.  Cardiac Enzymes Recent Labs  Lab 04/20/18 1837 04/20/18 2355 04/21/18 0554  TROPONINI 0.24* 0.19* 0.13*   ------------------------------------------------------------------------------------------------------------------ Invalid input(s): POCBNP   CBG: Recent Labs  Lab 04/21/18 1658 04/21/18 2127 04/22/18 0626 04/22/18 1153 04/22/18 1631  GLUCAP 168* 140* 106* 156* 121*       Studies: No results found.    Lab Results  Component Value Date   HGBA1C 9.2 (H) 04/21/2018   HGBA1C 6.4 (H) 11/08/2011   Lab Results  Component Value Date   CREATININE 0.80 04/22/2018       Scheduled Meds: . calcium carbonate  1 tablet Oral Q breakfast  . enoxaparin (LOVENOX) injection  40 mg Subcutaneous Q24H  . insulin aspart  0-5 Units Subcutaneous QHS  . insulin aspart  0-9 Units Subcutaneous TID WC  . latanoprost  1 drop Both Eyes QHS  . OLANZapine  10 mg Oral QPM   Continuous Infusions:   LOS: 2 days    Time spent: >30 MINS    Pleas KochJai Britney Newstrom, MD [facility] 5:25 PM  04/22/2018, 5:25 PM  LOS: 2 days

## 2018-04-22 NOTE — Progress Notes (Signed)
  2D Echocardiogram has been performed.  Pieter PartridgeBrooke S Nox Talent 04/22/2018, 11:15 AM

## 2018-04-23 LAB — BASIC METABOLIC PANEL
ANION GAP: 6 (ref 5–15)
BUN: 15 mg/dL (ref 8–23)
CO2: 29 mmol/L (ref 22–32)
Calcium: 9.3 mg/dL (ref 8.9–10.3)
Chloride: 107 mmol/L (ref 98–111)
Creatinine, Ser: 0.85 mg/dL (ref 0.44–1.00)
Glucose, Bld: 119 mg/dL — ABNORMAL HIGH (ref 70–99)
Potassium: 4 mmol/L (ref 3.5–5.1)
Sodium: 142 mmol/L (ref 135–145)

## 2018-04-23 LAB — CBC WITH DIFFERENTIAL/PLATELET
Abs Immature Granulocytes: 0 10*3/uL (ref 0.0–0.1)
Basophils Absolute: 0 10*3/uL (ref 0.0–0.1)
Basophils Relative: 1 %
EOS ABS: 0.2 10*3/uL (ref 0.0–0.7)
Eosinophils Relative: 4 %
HEMATOCRIT: 35.4 % — AB (ref 36.0–46.0)
Hemoglobin: 11.6 g/dL — ABNORMAL LOW (ref 12.0–15.0)
IMMATURE GRANULOCYTES: 1 %
LYMPHS PCT: 36 %
Lymphs Abs: 1.5 10*3/uL (ref 0.7–4.0)
MCH: 30.8 pg (ref 26.0–34.0)
MCHC: 32.8 g/dL (ref 30.0–36.0)
MCV: 93.9 fL (ref 78.0–100.0)
Monocytes Absolute: 0.4 10*3/uL (ref 0.1–1.0)
Monocytes Relative: 9 %
NEUTROS PCT: 49 %
Neutro Abs: 2 10*3/uL (ref 1.7–7.7)
PLATELETS: 241 10*3/uL (ref 150–400)
RBC: 3.77 MIL/uL — AB (ref 3.87–5.11)
RDW: 13.7 % (ref 11.5–15.5)
WBC: 4 10*3/uL (ref 4.0–10.5)

## 2018-04-23 LAB — GLUCOSE, CAPILLARY
GLUCOSE-CAPILLARY: 110 mg/dL — AB (ref 70–99)
GLUCOSE-CAPILLARY: 168 mg/dL — AB (ref 70–99)
Glucose-Capillary: 147 mg/dL — ABNORMAL HIGH (ref 70–99)
Glucose-Capillary: 154 mg/dL — ABNORMAL HIGH (ref 70–99)

## 2018-04-23 NOTE — Progress Notes (Signed)
[facility] PROGRESS NOTE  Stephanie Gould ZOX:[id number] DOB: 1941/03/14 DOA: 04/20/2018   PCP: [name], MD  Assessment/Plan: Principal Problem:   Acute encephalopathy Active Problems:   Hypertension   Diabetes mellitus (HCC)   Hypokalemia   Elevated troponin   77 y.o. female with medical history significant for diabetes, hypertension, presents to the emergency department via EMS G complaint acute encephalopathy  Versus progressive dementia. Initial evaluation reveals hypokalemia  Assessment and plan #1. Acute encephalopathy vs Undiagnosed vascular dementia. Family reports dementia since April of this year which has rapidly progressed Work-up including CT drug screen etc. negative -Resumed Zydis as daughter states was placed on this by Dr. [name] and this helps with sundowning Chest x-ray negative Ammonia normal  TSH slightly elevated T4 not ordered HIV RPR negative Discussed with daughter--- will need residential placement and memory unit potentially and currently in process   #2. Elevated troponin. Patient denies chest pain. EKG as noted above. Pt denies cp.  She does have an elevated troponin 0.24>0.19>0.13.does not report any chest pain. 2-D echo negative for wall motion issues  3. Diabetes. Serum glucose 1 10-1 68 A1c was 9.2 SSI, hold glipizide now but on discharge can resume at low-dose would not aggressively control  #4. Hypokalemia.  repleted.  Currently within normal range    DVT prophylaxsis lovenox   Code Status:  Full code    Family Communication: Discussed in detail with the patient's daughter that patient will need to be without a sitter for at least 24 hours Disposition Plan:   Anticipate discharge once can be without sitter to a skilled facility   Consultants:   none   Procedures:  None   Antibiotics: Anti-infectives (From admission, onward)   Start     Dose/Rate Route Frequency Ordered Stop   04/20/18 1145  cephALEXin  (KEFLEX) capsule 500 mg  Status:  Discontinued     500 mg Oral 4 times daily 04/20/18 1143 04/20/18 1439         HPI/Subjective:  Confused-very suspicious but does answer somewhat appropriately-no overt pain   Objective: Vitals:   04/22/18 1609 04/22/18 2041 04/23/18 0411 04/23/18 1202  BP: 124/68 (!) 139/116 (!) 142/72 136/66  Pulse: 71 86 60 81  Resp: 18 16 18 20   Temp: 97.8 F (36.6 C) 97.6 F (36.4 C)  98.6 F (37 C)  TempSrc: Oral Axillary  Oral  SpO2: 100% 100% 100% 100%    Intake/Output Summary (Last 24 hours) at 04/23/2018 1413 Last data filed at 04/23/2018 0845 Gross per 24 hour  Intake 419 ml  Output -  Net 419 ml    Exam:  Examination:  Younger than stated age week No icterus no pallor Chest is clear Neurologically intact Abdomen soft nontender   Data Reviewed: I have personally reviewed following labs and imaging studies  Micro Results No results found for this or any previous visit (from the past 240 hour(s)).  Radiology Reports Ct Head Wo Contrast  Result Date: 04/20/2018 CLINICAL DATA:  77 year old female with altered mental status. EXAM: CT HEAD WITHOUT CONTRAST TECHNIQUE: Contiguous axial images were obtained from the base of the skull through the vertex without intravenous contrast. COMPARISON:  11/08/2011 and prior head CTs FINDINGS: Brain: No evidence of acute infarction, hemorrhage, hydrocephalus, extra-axial collection or mass lesion/mass effect. Atrophy and chronic small-vessel white matter ischemic changes again noted. Vascular: Carotid atherosclerotic calcifications noted. Skull: Normal. Negative for fracture or focal lesion. Sinuses/Orbits: No acute finding.  Other: None. IMPRESSION: 1. No evidence of acute intracranial abnormality 2. Atrophy and chronic small-vessel white matter ischemic changes. Electronically Signed   By: Harmon PierJeffrey  Hu M.D.   On: 04/20/2018 07:33   Dg Chest Port 1 View  Result Date: 04/20/2018 CLINICAL DATA:  Altered  mental status. EXAM: PORTABLE CHEST 1 VIEW COMPARISON:  11/08/2011 FINDINGS: Shallow inspiration. Normal heart size and pulmonary vascularity. No focal airspace disease or consolidation in the lungs. No blunting of costophrenic angles. No pneumothorax. Mediastinal contours appear intact. IMPRESSION: No active disease. Electronically Signed   By: Burman NievesWilliam  Stevens M.D.   On: 04/20/2018 06:36     CBC Recent Labs  Lab 04/20/18 [id number] 04/20/18 0642 04/21/18 0554 04/23/18 0711  WBC 7.2  --  4.3 4.0  HGB 11.5* 12.2 10.5* 11.6*  HCT 35.6* 36.0 32.0* 35.4*  PLT 226  --  224 241  MCV 94.9  --  94.4 93.9  MCH 30.7  --  31.0 30.8  MCHC 32.3  --  32.8 32.8  RDW 13.6  --  13.9 13.7  LYMPHSABS 1.0  --   --  1.5  MONOABS 0.6  --   --  0.4  EOSABS 0.0  --   --  0.2  BASOSABS 0.0  --   --  0.0    Chemistries  Recent Labs  Lab 04/20/18 0632 04/20/18 0642 04/21/18 0554 04/22/18 0518 04/23/18 0711  NA 143 145 144 141 142  K 2.9* 3.0* 3.8 3.9 4.0  CL 108 107 110 108 107  CO2 25  --  26 22 29   GLUCOSE 146* 138* 109* 128* 119*  BUN 24* 29* 18 17 15   CREATININE 1.10* 0.90 0.90 0.80 0.85  CALCIUM 9.1  --  8.7* 9.2 9.3  AST 43*  --   --  23  --   ALT 22  --   --  18  --   ALKPHOS 58  --   --  55  --   BILITOT 1.0  --   --  0.7  --    ------------------------------------------------------------------------------------------------------------------ CrCl cannot be calculated (Unknown ideal weight.). ------------------------------------------------------------------------------------------------------------------ Recent Labs    04/21/18 1541  HGBA1C 9.2*   ------------------------------------------------------------------------------------------------------------------ No results for input(s): CHOL, HDL, LDLCALC, TRIG, CHOLHDL, LDLDIRECT in the last 72 hours. ------------------------------------------------------------------------------------------------------------------ Recent Labs     04/21/18 1547  TSH 4.642*   ------------------------------------------------------------------------------------------------------------------ No results for input(s): VITAMINB12, FOLATE, FERRITIN, TIBC, IRON, RETICCTPCT in the last 72 hours.  Coagulation profile No results for input(s): INR, PROTIME in the last 168 hours.  No results for input(s): DDIMER in the last 72 hours.  Cardiac Enzymes Recent Labs  Lab 04/20/18 1837 04/20/18 2355 04/21/18 0554  TROPONINI 0.24* 0.19* 0.13*   ------------------------------------------------------------------------------------------------------------------ Invalid input(s): POCBNP   CBG: Recent Labs  Lab 04/22/18 1153 04/22/18 1631 04/22/18 2153 04/23/18 0630 04/23/18 1156  GLUCAP 156* 121* 159* 110* 168*       Studies: No results found.    Lab Results  Component Value Date   HGBA1C 9.2 (H) 04/21/2018   HGBA1C 6.4 (H) 11/08/2011   Lab Results  Component Value Date   CREATININE 0.85 04/23/2018       Scheduled Meds: . calcium carbonate  1 tablet Oral Q breakfast  . enoxaparin (LOVENOX) injection  40 mg Subcutaneous Q24H  . insulin aspart  0-5 Units Subcutaneous QHS  . insulin aspart  0-9 Units Subcutaneous TID WC  . latanoprost  1 drop Both Eyes QHS  . OLANZapine  10  mg Oral QPM   Continuous Infusions:   LOS: 3 days    Time spent: >30 MINS    Pleas KochJai Andrzej Scully, MD [facility] 2:13 PM  04/23/2018, 2:13 PM  LOS: 3 days

## 2018-04-23 NOTE — Progress Notes (Signed)
Physical Therapy Treatment Patient Details Name: Stephanie Gould MRN: [id number] DOB: [birth year] Today's Date: 04/23/2018    History of Present Illness Patient is a 77 y/o female presenting to the ED on 04/20/18 with acute encephalopathy. PMH significant for HTN, DM. CT head with No evidence of acute intracranial abnormality.     PT Comments    Patient seen for mobility progression. Pt continues to be oriented to self only and with decreased safety awareness.  Pt requires min guard/min A for transfers and gait. Continue to progress as tolerated with anticipated d/c to SNF for further skilled PT services.    Follow Up Recommendations  SNF;Supervision/Assistance - 24 hour     Equipment Recommendations  Other (comment)(TBD)    Recommendations for Other Services       Precautions / Restrictions Precautions Precautions: Fall Restrictions Weight Bearing Restrictions: No    Mobility  Bed Mobility               General bed mobility comments: Pt OOB in chair upon arrival  Transfers Overall transfer level: Needs assistance Equipment used: 1 person hand held assist Transfers: Sit to/from Stand Sit to Stand: Min guard         General transfer comment: for safety  Ambulation/Gait Ambulation/Gait assistance: Min assist   Assistive device: 1 person hand held assist Gait Pattern/deviations: Step-through pattern;Decreased stride length;Drifts right/left;Trunk flexed Gait velocity: decreased   General Gait Details: pt relies on at least single UE support due to unsteady gait; pt "furniture walking"    Stairs             Wheelchair Mobility    Modified Rankin (Stroke Patients Only)       Balance Overall balance assessment: Needs assistance Sitting-balance support: No upper extremity supported;Feet supported Sitting balance-Leahy Scale: Good     Standing balance support: Single extremity supported;During functional activity Standing balance-Leahy Scale:  Poor Standing balance comment: reliant on external support                            Cognition Arousal/Alertness: Awake/alert Behavior During Therapy: WFL for tasks assessed/performed Overall Cognitive Status: Impaired/Different from baseline Area of Impairment: Orientation;Following commands;Safety/judgement;Problem solving;Attention;Memory;Awareness                 Orientation Level: Disoriented to;Place;Time;Situation Current Attention Level: Sustained Memory: Decreased short-term memory Following Commands: Follows one step commands with increased time Safety/Judgement: Decreased awareness of safety;Decreased awareness of deficits Awareness: Intellectual Problem Solving: Difficulty sequencing;Requires verbal cues General Comments: pt reports that it is [id number] and she is 77 yo; per family, patient has been confused for ~1 year, recently has begun wandering      Exercises      General Comments General comments (skin integrity, edema, etc.): family present in room      Pertinent Vitals/Pain Pain Assessment: No/denies pain    Home Living                      Prior Function            PT Goals (current goals can now be found in the care plan section) Acute Rehab PT Goals Patient Stated Goal: none stated PT Goal Formulation: With patient Time For Goal Achievement: 05/04/18 Potential to Achieve Goals: Good Progress towards PT goals: Progressing toward goals    Frequency    Min 2X/week      PT Plan Current plan remains appropriate  Co-evaluation              AM-PAC PT "6 Clicks" Daily Activity  Outcome Measure  Difficulty turning over in bed (including adjusting bedclothes, sheets and blankets)?: A Little Difficulty moving from lying on back to sitting on the side of the bed? : A Little Difficulty sitting down on and standing up from a chair with arms (e.g., wheelchair, bedside commode, etc,.)?: Unable Help needed moving to  and from a bed to chair (including a wheelchair)?: A Little Help needed walking in hospital room?: A Little Help needed climbing 3-5 steps with a railing? : A Lot 6 Click Score: 15    End of Session Equipment Utilized During Treatment: Gait belt Activity Tolerance: Patient tolerated treatment well Patient left: with call bell/phone within reach;with family/visitor present;in chair Nurse Communication: Mobility status PT Visit Diagnosis: Other abnormalities of gait and mobility (R26.89);Unsteadiness on feet (R26.81);Muscle weakness (generalized) (M62.81)     Time: [id number] PT Time Calculation (min) (ACUTE ONLY): 21 min  Charges:  $Gait Training: 8-22 mins                     Erline LevineKellyn Amandine Covino, PTA Pager: [phone]     Carolynne EdouardKellyn R Fayne Mcguffee 04/23/2018, 1:16 PM

## 2018-04-23 NOTE — Care Management Important Message (Signed)
Important Message  Patient Details  Name: Stephanie Gould MRN: [id number] Date of Birth: [birth year]   Medicare Important Message Given:  Yes    Clary Meeker Stefan ChurchBratton 04/23/2018, 4:11 PM

## 2018-04-24 LAB — GLUCOSE, CAPILLARY
GLUCOSE-CAPILLARY: 120 mg/dL — AB (ref 70–99)
Glucose-Capillary: 157 mg/dL — ABNORMAL HIGH (ref 70–99)

## 2018-04-24 MED ORDER — GLIPIZIDE ER 2.5 MG PO TB24: 10.0000 mg | ORAL_TABLET | Freq: Every day | ORAL | Status: DC

## 2018-04-24 MED ORDER — OLANZAPINE 10 MG PO TABS: 10.0000 mg | ORAL_TABLET | Freq: Every evening | ORAL | 0 refills | Status: DC

## 2018-04-24 MED ORDER — TRAVOPROST (BAK FREE) 0.004 % OP SOLN: 1.0000 [drp] | Freq: Every day | OPHTHALMIC | 0 refills | Status: DC

## 2018-04-24 MED ORDER — ALPRAZOLAM 0.5 MG PO TABS: 0.5000 mg | ORAL_TABLET | Freq: Two times a day (BID) | ORAL | 0 refills | Status: DC | PRN

## 2018-04-24 NOTE — Discharge Summary (Signed)
Physician Discharge Summary  Calton DachCeola Kitzmiller ZOX:[id number] DOB: Feb 02, 1941 DOA: 04/20/2018  PCP: Gwenyth Benderean, Eric L, MD  Admit date: 04/20/2018 Discharge date: 04/24/2018  Time spent: 20 minutes  Recommendations for Outpatient Follow-up:  1. Need cbc and bmet 1 week 2. Suggest GOC as OP given stg 3-4 dementia 3. Suggest Zydis to be gven around 4 pm, Use Xana prn sparingly for nightly sleep 4. Please recheck TSH and t4 in 2 weeks--if concordant with high TSH might need thyroxine initation  Discharge Diagnoses:  Principal Problem:   Acute encephalopathy Active Problems:   Hypertension   Diabetes mellitus (HCC)   Hypokalemia   Elevated troponin   Discharge Condition: gaurded  Diet recommendation: soft  There were no vitals filed for this visit.  History of present illness:  77 y.o.femalewith medical history significantfor diabetes, hypertension, presents to the emergency department via EMS G complaint acute encephalopathy  Versus progressive dementia. Initial evaluation reveals hypokalemia  Hospital Course:  Assessment and plan #1. Acute encephalopathy vs Undiagnosed vascular dementia. Family reports dementia since April of this year which has rapidly progressed Work-up including CT drug screen etc. negative -Resumed Zydis as daughter states was placed on this by Dr. August Saucerean and this helps with sundowning -adding BZD prn bid for needs Chest x-ray negative Ammonia normal  TSH slightly elevated T4 not ordered HIV RPR negative Discussed with daughter--- sending to skilled facility-needs eventual goals of care     #2. Elevated troponin. Patient denies chest pain. EKG as noted above. Pt denies cp.  She does have an elevated troponin 0.24>0.19>0.13.does not report any chest pain. 2-D echo negative for wall motion issues   3. Diabetes. Serum glucose 1 10-1 68 A1c was 9.2 SSI, hold glipizide now but on discharge can resume at low-dose would not aggressively control   #4. Hypokalemia.   repleted.  Currently within normal range     Procedures:   multiple  Consultations:  none  Discharge Exam: Vitals:   04/24/18 0443 04/24/18 0824  BP: 120/74 (!) 120/54  Pulse: 73 76  Resp: 18 16  Temp: 98.3 F (36.8 C) 97.6 F (36.4 C)  SpO2: 100% 99%    General: alert not completely oriented  Cardiovascular: s1 s 2no m/r/g Respiratory: clea rno adde dsoudn No LE edema  Discharge Instructions   Discharge Instructions    Diet - low sodium heart healthy   Complete by:  As directed    Increase activity slowly   Complete by:  As directed      Allergies as of 04/24/2018   No Known Allergies     Medication List    TAKE these medications   ALPRAZolam 0.5 MG tablet Commonly known as:  XANAX Take 1 tablet (0.5 mg total) by mouth 2 (two) times daily as needed for anxiety.   glipiZIDE 2.5 MG 24 hr tablet Commonly known as:  GLUCOTROL XL Take 4 tablets (10 mg total) by mouth daily with breakfast. What changed:  medication strength   OLANZapine 10 MG tablet Commonly known as:  ZYPREXA Take 1 tablet (10 mg total) by mouth every evening.   Travoprost (BAK Free) 0.004 % Soln ophthalmic solution Commonly known as:  TRAVATAN Place 1 drop into both eyes at bedtime.      No Known Allergies    The results of significant diagnostics from this hospitalization (including imaging, microbiology, ancillary and laboratory) are listed below for reference.    Significant Diagnostic Studies: Ct Head Wo Contrast  Result Date: 04/20/2018 CLINICAL DATA:  77 year old female with altered mental status. EXAM: CT HEAD WITHOUT CONTRAST TECHNIQUE: Contiguous axial images were obtained from the base of the skull through the vertex without intravenous contrast. COMPARISON:  11/08/2011 and prior head CTs FINDINGS: Brain: No evidence of acute infarction, hemorrhage, hydrocephalus, extra-axial collection or mass lesion/mass effect. Atrophy and chronic small-vessel white matter ischemic  changes again noted. Vascular: Carotid atherosclerotic calcifications noted. Skull: Normal. Negative for fracture or focal lesion. Sinuses/Orbits: No acute finding. Other: None. IMPRESSION: 1. No evidence of acute intracranial abnormality 2. Atrophy and chronic small-vessel white matter ischemic changes. Electronically Signed   By: Harmon PierJeffrey  Hu M.D.   On: 04/20/2018 07:33   Dg Chest Port 1 View  Result Date: 04/20/2018 CLINICAL DATA:  Altered mental status. EXAM: PORTABLE CHEST 1 VIEW COMPARISON:  11/08/2011 FINDINGS: Shallow inspiration. Normal heart size and pulmonary vascularity. No focal airspace disease or consolidation in the lungs. No blunting of costophrenic angles. No pneumothorax. Mediastinal contours appear intact. IMPRESSION: No active disease. Electronically Signed   By: Burman NievesWilliam  Stevens M.D.   On: 04/20/2018 06:36    Microbiology: No results found for this or any previous visit (from the past 240 hour(s)).   Labs: Basic Metabolic Panel: Recent Labs  Lab 04/20/18 0632 04/20/18 0642 04/21/18 0554 04/22/18 0518 04/23/18 0711  NA 143 145 144 141 142  K 2.9* 3.0* 3.8 3.9 4.0  CL 108 107 110 108 107  CO2 25  --  26 22 29   GLUCOSE 146* 138* 109* 128* 119*  BUN 24* 29* 18 17 15   CREATININE 1.10* 0.90 0.90 0.80 0.85  CALCIUM 9.1  --  8.7* 9.2 9.3   Liver Function Tests: Recent Labs  Lab 04/20/18 0632 04/22/18 0518  AST 43* 23  ALT 22 18  ALKPHOS 58 55  BILITOT 1.0 0.7  PROT 7.2 6.9  ALBUMIN 3.9 3.6   No results for input(s): LIPASE, AMYLASE in the last 168 hours. Recent Labs  Lab 04/21/18 1532  AMMONIA 15   CBC: Recent Labs  Lab 04/20/18 0632 04/20/18 0642 04/21/18 0554 04/23/18 0711  WBC 7.2  --  4.3 4.0  NEUTROABS 5.5  --   --  2.0  HGB 11.5* 12.2 10.5* 11.6*  HCT 35.6* 36.0 32.0* 35.4*  MCV 94.9  --  94.4 93.9  PLT 226  --  224 241   Cardiac Enzymes: Recent Labs  Lab 04/20/18 1837 04/20/18 2355 04/21/18 0554  TROPONINI 0.24* 0.19* 0.13*    BNP: BNP (last 3 results) No results for input(s): BNP in the last 8760 hours.  ProBNP (last 3 results) No results for input(s): PROBNP in the last 8760 hours.  CBG: Recent Labs  Lab 04/23/18 0630 04/23/18 1156 04/23/18 1641 04/23/18 2154 04/24/18 0629  GLUCAP 110* 168* 147* 154* 120*       Signed:  Rhetta MuraJai-Gurmukh Onur Mori MD   [facility] 04/24/2018, 10:45 AM

## 2018-04-24 NOTE — Clinical Social Work Placement (Signed)
Nurse to call report to [phone], Room 164     CLINICAL SOCIAL WORK PLACEMENT  NOTE  Date:  04/24/2018  Patient Details  Name: Stephanie Gould MRN: [id number] Date of Birth: 03-21-1941  Clinical Social Work is seeking post-discharge placement for this patient at the Skilled  Nursing Facility level of care (*CSW will initial, date and re-position this form in  chart as items are completed):  Yes   Patient/family provided with [facility] Department's list of facilities offering this level of care within the geographic area requested by the patient (or if unable, by the patient's family).  Yes   Patient/family informed of their freedom to choose among providers that offer the needed level of care, that participate in Medicare, Medicaid or managed care program needed by the patient, have an available bed and are willing to accept the patient.  Yes   Patient/family informed of [facility]'s ownership interest in [facility] and [facility], as well as of the fact that they are under no obligation to receive care at these facilities.  PASRR submitted to EDS on       PASRR number received on       Existing PASRR number confirmed on       FL2 transmitted to all facilities in geographic area requested by pt/family on       FL2 transmitted to all facilities within larger geographic area on       Patient informed that his/her managed care company has contracts with or will negotiate with certain facilities, including the following:        Yes   Patient/family informed of bed offers received.  Patient chooses bed at ([facility])     Physician recommends and patient chooses bed at      Patient to be [facility] on 04/24/18.  Patient to be transferred to facility by [facility]     Patient family notified on 04/24/18 of transfer.  Name of family member notified:  Dois DavenportSandra     PHYSICIAN       Additional Comment:     _______________________________________________ Baldemar LenisElizabeth M Tnya Ades, LCSW 04/24/2018, 12:55 PM

## 2018-11-09 ENCOUNTER — Ambulatory Visit: Payer: Medicare Other | Admitting: Neurology

## 2018-12-04 ENCOUNTER — Ambulatory Visit: Payer: Medicare Other | Admitting: Neurology

## 2019-01-11 ENCOUNTER — Telehealth: Payer: Self-pay

## 2019-01-11 NOTE — Telephone Encounter (Signed)
I contacted the pt's daughter in regards to her mother's appt scheduled for 01/12/19 at 3 pm. I advised, due to current COVID 19 pandemic, our office is severely reducing in office visits for at least the next 2 weeks, in order to minimize the risk to our patients and healthcare providers.   I offered a video visit for tomorrow at the same time and daughter accepted.  She understands that although there may be some limitations with this type of vistt, we will take all precautions to reduce any security or privacy concerns.  Pt understands that this will be treated like an in office visit and we will file with pt's insurance, and there may be a patient responsible charge related to this service.   Pt's email is [email or web address].  Pt's daughter will be e-mailing me a list of the pt's meds.  Allergies and PMH have been updated.

## 2019-01-12 ENCOUNTER — Other Ambulatory Visit: Payer: Self-pay

## 2019-01-12 ENCOUNTER — Encounter: Payer: Self-pay | Admitting: Neurology

## 2019-01-12 ENCOUNTER — Ambulatory Visit (INDEPENDENT_AMBULATORY_CARE_PROVIDER_SITE_OTHER): Payer: Medicare Other | Admitting: Neurology

## 2019-01-12 DIAGNOSIS — G301 Alzheimer's disease with late onset: Secondary | ICD-10-CM | POA: Diagnosis not present

## 2019-01-12 DIAGNOSIS — G309 Alzheimer's disease, unspecified: Secondary | ICD-10-CM

## 2019-01-12 DIAGNOSIS — F0281 Dementia in other diseases classified elsewhere with behavioral disturbance: Secondary | ICD-10-CM

## 2019-01-12 DIAGNOSIS — F0280 Dementia in other diseases classified elsewhere without behavioral disturbance: Secondary | ICD-10-CM | POA: Insufficient documentation

## 2019-01-12 MED ORDER — DIVALPROEX SODIUM 125 MG PO DR TAB: 125.0000 mg | DELAYED_RELEASE_TABLET | Freq: Three times a day (TID) | ORAL | 3 refills | Status: DC

## 2019-01-12 MED ORDER — DONEPEZIL HCL 10 MG PO TABS: 10.0000 mg | ORAL_TABLET | Freq: Every day | ORAL | 5 refills | Status: DC

## 2019-01-12 NOTE — Progress Notes (Signed)
Virtual Visit via Video Note  I connected with [name] on 01/12/19 at  3:00 PM EDT by a video enabled telemedicine application and verified that I am speaking with the correct person using two identifiers.  Location: Patient: The patient is at home. Provider: The physician is in office.   I discussed the limitations of evaluation and management by telemedicine and the availability of in person appointments. The patient expressed understanding and agreed to proceed.  History of Present Illness: Stephanie Gould is a 78 year old right-handed black female with a history of a progressive memory disturbance for least 1 year.  The patient was living alone, by August 2019, the patient was noted to be wandering outside the house, she was brought to the emergency room for an evaluation.  She has required aid and assistance in the home environment since that time, she lives with her granddaughter on weekends.  She is no longer able to keep up with her medications or appointments, she does not operate a motor vehicle and never has.  She requires it assistance with keeping up with finances, prior to the August hospitalization she was not paying her bills properly and was not taking her medication and was not going for her doctor's visits.  The patient does have problems with some agitation on a regular basis, she will sundown at times.  She was seeing children playing in the backyard who were not there.  At nighttime, she may get up in the middle the night and wander about the house.  The family keeps the door locks that she cannot wander outside of the house.  The patient does at times require some assistance with bathing and dressing.  She is on alprazolam 1 mg twice a day if needed, this is no longer as effective as it was for agitation.  She takes Zyprexa 10 mg at night.  She is on low-dose donepezil taking 5 mg at bedtime.  She seemed to tolerate the Aricept fairly well.  She is sent to this office  for an evaluation.  The family reports that she is falling some.  The patient does report some numbness in the feet, she may have a bit of a neuropathy.   Observations/Objective: On the video evaluation, the patient is alert, she will cooperate some of the examination.  She has full extraocular movements.  Facial symmetry is present, she is able to protrude the tongue in the midline with good lateral movements of the tongue.  Speech is well enunciated, not aphasic.  The patient is able to ambulate but she has decreased arm swing, shuffling gait.  She does have a walker, but does not use it regularly.  Romberg is negative.  On the mental status evaluation, the patient was oriented to place, she did not know the date or the day of the week, month or year.  She was able to get 0 of 3 words at 5 minutes.  She was able to repeat 5 numbers forward, she could not repeat 3 numbers backwards.  She was unable to do serial sevens.  She was unable to do word abstraction, similarities.  Assessment and Plan: 1.  Progressive dementia, Alzheimer's disease  2.  Gait disorder, parkinsonism  3.  Diabetes with diabetic neuropathy  The patient does have some features of parkinsonism on clinical examination.  This could in part be related to the Zyprexa use for agitation.  The Aricept will be increased to 10 mg at night.  The patient  will be placed on low-dose Depakote taking 125 mg capsules 3 times daily.  The family will contact her office if she is not tolerating this drug.  She will follow-up here in 5 or 6 months, sooner if needed.  Follow Up Instructions: 2175-month follow-up, may see nurse practitioner.   I discussed the assessment and treatment plan with the patient. The patient was provided an opportunity to ask questions and all were answered. The patient agreed with the plan and demonstrated an understanding of the instructions.   The patient was advised to call back or seek an in-person evaluation if the  symptoms worsen or if the condition fails to improve as anticipated.  I provided 30 minutes of non-face-to-face time during this encounter.   York Spanielharles K Pearle Wandler, MD

## 2019-01-26 ENCOUNTER — Telehealth: Payer: Self-pay | Admitting: Neurology

## 2019-01-26 NOTE — Telephone Encounter (Signed)
The daughter of pt(not on dpr) has called expressing great concern about pt being on divalproex (DEPAKOTE) 125 MG DR tablet.  The daughter states that it has helped in calming pt down but she will be sitting and its as if she is having a muscle spasms and she is doing sit ups.  When pt is asked if it hurts pt says no and she cant help doing it.  Pt daughter began asking questions, she was told that because she is not on a DPR information in the chart could not be discussed.  Daughter was told that a message would be sent expressing her concerns about the pt but that I could not guarantee a call back.  Daughter became upset then stated she will be taking pt off of divalproex (DEPAKOTE) 125 MG DR tablet because of how it is affecting the patient.

## 2019-01-26 NOTE — Telephone Encounter (Signed)
I called and talk with the family.  The patient is having what appears to be flexion movements of the abdomen with sitting, and then the legs will pull up when she is lying down intermittently.  I am not clear this is related to Depakote, it may be related to the Zyprexa that she is on, could potentially be a tardive movement problem.  At any rate, the Depakote will be reduced to 125 mg twice daily for 2 or 3 days, if she is no better go to 1 a day for 2 to 3 days and then stop the medication.  If the problem continues, I would be more concerned about the Zyprexa.

## 2019-01-27 ENCOUNTER — Telehealth: Payer: Self-pay

## 2019-01-27 NOTE — Telephone Encounter (Signed)
Left vm for patient to return call so we could schedule 6 month f/u. Ok for pt to be scheduled with NP.

## 2019-05-04 ENCOUNTER — Telehealth: Payer: Self-pay | Admitting: Neurology

## 2019-05-04 NOTE — Telephone Encounter (Signed)
I called the daughter, left a message, I will call back later.  If there are concerns about a stroke, the patient will need to go to the emergency room.

## 2019-05-04 NOTE — Telephone Encounter (Signed)
Pt's daughter called stating that the pt is complaining of pins and needles sensations on the bottom of her feet and also the daughter states that her mouth is twisting to the side. Please advise.

## 2019-05-04 NOTE — Telephone Encounter (Signed)
I called the daughter.  The patient 3 weeks ago began curling under her lips into her mouth, she will have some movements of the tongue as well, this could be a manifestation of tar dive dyskinesia associated with Zyprexa.  The patient is also complaining of discomfort in her feet on both sides, she does have a history of diabetes and likely has a diabetic peripheral neuropathy associated with this.  I will need to get the patient into the office, she has no schedule revisit.

## 2019-05-04 NOTE — Telephone Encounter (Signed)
Called the patient's daughter to offer a work in slot for the patient to get her seen in the next couple weeks. There was no answer. LVM for the daughter to call back.

## 2019-06-21 ENCOUNTER — Telehealth: Payer: Self-pay | Admitting: Neurology

## 2019-06-21 NOTE — Telephone Encounter (Signed)
This pt could see one of our Nurse Practitioner Clarise Cruz, Amy or [place]). I think they have openings before January. Thanks!

## 2019-06-21 NOTE — Telephone Encounter (Signed)
Pts daughter called to schedule F/U. Dr. Jannifer Franklin next available is January. I Scheduled pt for January. Do you want her seen sooner and if so when would you like her scheduled? Thank you

## 2019-06-22 ENCOUNTER — Ambulatory Visit: Payer: Medicare Other | Admitting: Neurology

## 2019-06-29 NOTE — Progress Notes (Signed)
PATIENT: Stephanie Gould DOB: June 15, 1941  REASON FOR VISIT: follow up HISTORY FROM: patient  Chief Complaint  Patient presents with   Follow-up    Room 8, with grand daugheter. No changes. Steady. No worse no better.     HISTORY OF PRESENT ILLNESS: Today 06/30/19 Stephanie Gould is a 78 y.o. female here today for follow up. She presents with her granddaughter who aids in history. She is fairly stable. Memory hasn't improved or worsened. Family has noticed for the past 6 months that she is making repetitive movements with lips and tongue. She is rolling lips under and holding with her teeth. She is constantly moving her legs. It is like she is dancing siting down. She sometimes rubs her feet and complains of knee pain but denies any pain in the office today. She was not able to tolerate increased dose of Aricept and is now taking  at bedtime. She is followed closely by PCP. She was recently started on Ativan  twice daily as needed for agitation. Granddaughter is uncertain if this has helped much but no worsening.   HISTORY: (copied from [name]' note on 01/12/2019)  Stephanie Gould is a 44 year old right-handed black female with a history of a progressive memory disturbance for least 1 year.  The patient was living alone, by August 2019, the patient was noted to be wandering outside the house, she was brought to the emergency room for an evaluation.  She has required aid and assistance in the home environment since that time, she lives with her granddaughter on weekends.  She is no longer able to keep up with her medications or appointments, she does not operate a motor vehicle and never has.  She requires it assistance with keeping up with finances, prior to the August hospitalization she was not paying her bills properly and was not taking her medication and was not going for her doctor's visits.  The patient does have problems with some agitation on a regular basis, she will sundown at  times.  She was seeing children playing in the backyard who were not there.  At nighttime, she may get up in the middle the night and wander about the house.  The family keeps the door locks that she cannot wander outside of the house.  The patient does at times require some assistance with bathing and dressing.  She is on alprazolam 1 mg twice a day if needed, this is no longer as effective as it was for agitation.  She takes Zyprexa 10 mg at night.  She is on low-dose donepezil taking 5 mg at bedtime.  She seemed to tolerate the Aricept fairly well.  She is sent to this office for an evaluation.  The family reports that she is falling some.  The patient does report some numbness in the feet, she may have a bit of a neuropathy.   REVIEW OF SYSTEMS: Out of a complete 14 system review of symptoms, the patient complains only of the following symptoms, memory loss, repetitive movement of lips and legs and all other reviewed systems are negative.  ALLERGIES: No Active Allergies  HOME MEDICATIONS: Outpatient Medications Prior to Visit  Medication Sig Dispense Refill   amLODipine-benazepril (LOTREL) 5-20 MG capsule Take 1 capsule by mouth daily.     glipiZIDE (GLUCOTROL XL) 10 MG 24 hr tablet Take 10 mg by mouth daily with breakfast.     LORazepam (ATIVAN) 1 MG tablet TK 1 T PO BID UTD  rosuvastatin (CRESTOR) 5 MG tablet Take 5 mg by mouth daily.     donepezil (ARICEPT) 10 MG tablet Take 1 tablet (10 mg total) by mouth at bedtime. 30 tablet 5   OLANZapine (ZYPREXA) 10 MG tablet Take 10 mg by mouth at bedtime.     ALPRAZolam (XANAX) 1 MG tablet Take 1 mg by mouth 2 (two) times daily as needed for anxiety.     amLODipine (NORVASC) 5 MG tablet Take 5 mg by mouth daily.     calcium carbonate (OSCAL) 1500 (600 Ca) MG TABS tablet Take 600 mg of elemental calcium by mouth daily with breakfast.     divalproex (DEPAKOTE) 125 MG DR tablet Take 1 tablet (125 mg total) by mouth 3 (three) times daily.  90 tablet 3   fluticasone (VERAMYST) 27.5 MCG/SPRAY nasal spray Place 2 sprays into the nose daily.     Glucosamine-Chondroit-Vit C-Mn (GLUCOSAMINE 1500 COMPLEX PO) Take 1 tablet by mouth daily.     losartan (COZAAR) 50 MG tablet Take 50 mg by mouth daily.     Multiple Vitamin (MULTIVITAMIN) tablet Take 1 tablet by mouth daily.     ranitidine (ZANTAC) 75 MG tablet Take 75 mg by mouth at bedtime.     No facility-administered medications prior to visit.     PAST MEDICAL HISTORY: Past Medical History:  Diagnosis Date   Acute encephalopathy    Allergic rhinosinusitis    Diabetes mellitus    Elevated troponin    Hypertension    Hypokalemia    Obesity     PAST SURGICAL HISTORY: Past Surgical History:  Procedure Laterality Date   FOOT SURGERY      FAMILY HISTORY: Family History  Problem Relation Age of Onset   Dementia Mother    Diverticulitis Sister    Dementia Brother     SOCIAL HISTORY: Social History   Socioeconomic History   Marital status: Divorced    Spouse name: Not on file   Number of children: Not on file   Years of education: Not on file   Highest education level: Not on file  Occupational History   Not on file  Social Needs   Financial resource strain: Not on file   Food insecurity    Worry: Not on file    Inability: Not on file   Transportation needs    Medical: Not on file    Non-medical: Not on file  Tobacco Use   Smoking status: Never Smoker   Smokeless tobacco: Never Used  Substance and Sexual Activity   Alcohol use: No   Drug use: No   Sexual activity: Not on file  Lifestyle   Physical activity    Days per week: Not on file    Minutes per session: Not on file   Stress: Not on file  Relationships   Social connections    Talks on phone: Not on file    Gets together: Not on file    Attends religious service: Not on file    Active member of club or organization: Not on file    Attends meetings of clubs or  organizations: Not on file    Relationship status: Not on file   Intimate partner violence    Fear of current or ex partner: Not on file    Emotionally abused: Not on file    Physically abused: Not on file    Forced sexual activity: Not on file  Other Topics Concern   Not on file  Social  History Narrative   Not on file      PHYSICAL EXAM  Vitals:   06/30/19 1128  BP: 138/72  Pulse: 72  Temp: 97.9 F (36.6 C)  Weight: 168 lb (76.2 kg)  Height: 5\' 5"  (1.651 m)   Body mass index is 27.96 kg/m.  Generalized: Well developed, in no acute distress  Cardiology: normal rate and rhythm, no murmur noted Respiratory: clear to auscultation bilaterally Neurological examination  Mentation: Alert, not oriented to time, place, or history taking. Follows intermittent commands speech and language fluent Cranial nerve II-XII: Pupils were equal round reactive to light. Unable to assess as patient not following commands Motor: The motor testing reveals 5 over 5 strength of all 4 extremities. Good symmetric motor tone is noted throughout. Akathisia noted of bilateral lower extremities, patient rolls lips repeatitively Sensory: Sensory testing is intact to soft touch on all 4 extremities. No evidence of extinction is noted.  Coordination: patient will not follow commands  Gait and station: Gait is shuffled, mildly stooped posture, tandem not attempted  Reflexes: Deep tendon reflexes are symmetric and normal bilaterally.   DIAGNOSTIC DATA (LABS, IMAGING, TESTING) - I reviewed patient records, labs, notes, testing and imaging myself where available.  MMSE - Mini Mental State Exam 06/30/2019  Not completed: Unable to complete     Lab Results  Component Value Date   WBC 4.0 04/23/2018   HGB 11.6 (L) 04/23/2018   HCT 35.4 (L) 04/23/2018   MCV 93.9 04/23/2018   PLT 241 04/23/2018      Component Value Date/Time   NA 142 04/23/2018 0711   K 4.0 04/23/2018 0711   CL 107 04/23/2018 0711     CO2 29 04/23/2018 0711   GLUCOSE 119 (H) 04/23/2018 0711   BUN 15 04/23/2018 0711   CREATININE 0.85 04/23/2018 0711   CALCIUM 9.3 04/23/2018 0711   CALCIUM 9.0 11/09/2011 0500   PROT 6.9 04/22/2018 0518   ALBUMIN 3.6 04/22/2018 0518   AST 23 04/22/2018 0518   ALT 18 04/22/2018 0518   ALKPHOS 55 04/22/2018 0518   BILITOT 0.7 04/22/2018 0518   GFRNONAA >60 04/23/2018 0711   GFRAA >60 04/23/2018 0711   No results found for: CHOL, HDL, LDLCALC, LDLDIRECT, TRIG, CHOLHDL Lab Results  Component Value Date   HGBA1C 9.2 (H) 04/21/2018   No results found for: VITAMINB12 Lab Results  Component Value Date   TSH 4.642 (H) 04/21/2018     ASSESSMENT AND PLAN 78 y.o. year old female  has a past medical history of Acute encephalopathy, Allergic rhinosinusitis, Diabetes mellitus, Elevated troponin, Hypertension, Hypokalemia, and Obesity. here with     ICD-10-CM   1. Late onset Alzheimer's disease with behavioral disturbance (HCC)  G30.1    F02.81   2. 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  There have been concerns of repetitive and restless movements for the past 6 months.  After review of her medication list, I feel that symptoms are most consistent with tardive dyskinesias.  This is most likely related to Zyprexa.  It is unclear how long she has been on this medication.  She had tolerated it well otherwise.  We will attempt to wean Zyprexa slowly.  I have advised that they start 7.5 mg at bedtime.  We will discontinue Zyprexa 10 mg.  They will continue close follow-up with Dr. Marlou Sa, PCP.  She will continue Ativan 1 mg twice daily as needed for agitation.  We will continue Aricept at 5 mg daily.  I have discussed my concerns with her granddaughter in detail.  Additional information provided in AVS for family to review.  They will call with any worsening or new symptoms.  We will continue to monitor concerns of pain.  May consider adding gabapentin in  future.  She will follow-up with me in 4 weeks, sooner if needed.  Her granddaughter verbalizes understanding and agreement with this plan.   No orders of the defined types were placed in this encounter.    Meds ordered this encounter  Medications   OLANZapine (ZYPREXA) 7.5 MG tablet    Sig: Take 1 tablet (7.5 mg total) by mouth at bedtime.    Dispense:  30 tablet    Refill:  1    Order Specific Question:   Supervising Provider    Answer:   Anson Fret [[id number]]   donepezil (ARICEPT) 10 MG tablet    Sig: Take 0.5 tablets (5 mg total) by mouth at bedtime.    Dispense:  30 tablet    Refill:  5    Order Specific Question:   Supervising Provider    Answer:   Anson Fret [id number]      I spent 45 minutes with the patient. 50% of this time was spent counseling and educating patient on plan of care and medications.     Shawnie Dapper, FNP-C 06/30/2019, 12:38 PM [facility] [address] [place], [place] [address] [phone]

## 2019-06-30 ENCOUNTER — Ambulatory Visit (INDEPENDENT_AMBULATORY_CARE_PROVIDER_SITE_OTHER): Payer: Medicare Other | Admitting: Family Medicine

## 2019-06-30 ENCOUNTER — Other Ambulatory Visit: Payer: Self-pay

## 2019-06-30 ENCOUNTER — Encounter: Payer: Self-pay | Admitting: Family Medicine

## 2019-06-30 ENCOUNTER — Encounter

## 2019-06-30 VITALS — BP 138/72 | HR 72 | Temp 97.9°F | Ht 65.0 in | Wt 168.0 lb

## 2019-06-30 DIAGNOSIS — F0281 Dementia in other diseases classified elsewhere with behavioral disturbance: Secondary | ICD-10-CM | POA: Diagnosis not present

## 2019-06-30 DIAGNOSIS — G2401 Drug induced subacute dyskinesia: Secondary | ICD-10-CM | POA: Diagnosis not present

## 2019-06-30 DIAGNOSIS — G301 Alzheimer's disease with late onset: Secondary | ICD-10-CM

## 2019-06-30 DIAGNOSIS — F02818 Dementia in other diseases classified elsewhere, unspecified severity, with other behavioral disturbance: Secondary | ICD-10-CM

## 2019-06-30 MED ORDER — OLANZAPINE 7.5 MG PO TABS: 7.5000 mg | ORAL_TABLET | Freq: Every day | ORAL | 1 refills | Status: DC

## 2019-06-30 MED ORDER — DONEPEZIL HCL 10 MG PO TABS: 5.0000 mg | ORAL_TABLET | Freq: Every day | ORAL | 5 refills | Status: DC

## 2019-06-30 NOTE — Patient Instructions (Addendum)
We need to decrease Zyprexa dose to 7.5 mg daily. We will plan to wean this medication slowly due to concerns of tardive dyskinesia as discussed in the office. More info available below.   Please continue Ativan 1mg  as needed for agitation.   We will continue to monitor concerns for neuropathy. May add low dose gabapentin in future pending response to weaning Zyprexa.   Follow up in 4 weeks  Tardive Dyskinesia Tardive dyskinesia is a disorder that causes uncontrollable body movements. It occurs in some people who are taking certain medicines to treat a mental illness (neuroleptic medicine) or have taken this type of medicine in the past. These medicines block the effects of a specific brain chemical (dopamine). Sometimes, tardive dyskinesia starts months or years after someone took the medicine. Not everyone who takes a neuroleptic medicine will get tardive dyskinesia. What are the causes? This condition is caused by changes in your brain that are associated with taking a neuroleptic medicine. What increases the risk? If you are taking a neuroleptic medicine, your risk for tardive dyskinesia may be higher if you:  Are taking an older type of neuroleptic medicine.  Have been taking the medicine for a long time at a high dose.  Are a woman past the age of menopause.  Are older than 60.  Have a history of alcohol or drug abuse. What are the signs or symptoms? Abnormal, uncontrollable movements are the main symptom of tardive dyskinesia. These types of movements may include:  Grimacing.  Sticking out or twisting your tongue.  Making chewing or sucking sounds.  Making grunting or sighing noises.  Blinking your eyes.  Twisting, swaying, or thrusting your body.  Foot tapping or finger waving.  Rapid movements of your arms or legs. How is this diagnosed? Your health care provider may suspect that you have tardive dyskinesia if:  You have been taking neuroleptic medicines.   You have abnormal movements that you cannot control. If you are taking a medicine that can cause tardive dyskinesia, your health care provider may screen you for early signs of the condition. This may include:  Observing your body movements.  Using a specific rating scale called the Abnormal Involuntary Movement Scale (AIMS). You may also have tests to rule out other conditions that cause abnormal body movements, including:  Parkinson's disease.  Huntington's disease.  Stroke. How is this treated? The best treatment for tardive dyskinesia is to lower the dose of your medicine or to switch to a different medicine at the first sign of abnormal and uncontrolled movements. There is no cure for long-term (chronic) tardive dyskinesia. Some medicines may help control the movements. These include:  Clozapine, a medicine used to treat mental illness (antipsychotic).  Some muscle relaxants.  Some anti-seizure medicines.  Some medicines used to treat high blood pressure.  Some tranquilizers (sedatives). Follow these instructions at home:      Take over-the-counter and prescription medicines only as told by your health care provider. Do not stop or start taking any medicines without talking to your health care provider first.  Do not abuse drugs or alcohol.  Keep all follow-up visits as told by your health care provider. This is important. Contact a health care provider if:  You are unable to eat or drink.  You have had a fall.  Your symptoms get worse. Summary  Tardive dyskinesia is a disorder that causes uncontrollable body movements. These may include grimacing, sticking out or twisting your tongue, blinking your eyes, or rapid  movements of your arms or legs.  The condition occurs in some people who are taking certain medicines to treat a mental illness (neuroleptic medicine) or have taken this type of medicine in the past.  The best treatment for tardive dyskinesia is to lower  the dose of your medicine or to switch to a different medicine at the first sign of abnormal and uncontrolled movements.  There is no cure for long-term (chronic) tardive dyskinesia, but some medicines may help control the movements. This information is not intended to replace advice given to you by your health care provider. Make sure you discuss any questions you have with your health care provider. Document Released: 08/16/2002 Document Revised: 09/18/2017 Document Reviewed: 09/18/2017 Elsevier Patient Education  2020 [employer].

## 2019-06-30 NOTE — Progress Notes (Signed)
I have read the note, and I agree with the clinical assessment and plan.  Dracen Reigle K Laquasia Pincus   

## 2019-08-04 ENCOUNTER — Ambulatory Visit: Payer: Medicare Other | Admitting: Family Medicine

## 2019-09-14 ENCOUNTER — Ambulatory Visit: Payer: Medicare Other | Admitting: Family Medicine

## 2019-09-14 NOTE — Progress Notes (Signed)
PATIENT: Stephanie Gould DOB: [birth year]  REASON FOR VISIT: follow up HISTORY FROM: patient  Chief Complaint  Patient presents with   Follow-up    4 wk f/u. Granddaughter present. Rm 1. Patient's granddaughter mentioned that she doesnt naw at her mouth but she is now constantly smacking her mouth like shes chewing on something. She is still moving her feet.     HISTORY OF PRESENT ILLNESS: Today 09/15/19 Stephanie Gould is a 79 y.o. female here today for follow up for memory and concerns of tardive dyskinesia related to Zyprexa. Dose decreased to 7.5mg  in October.  Her granddaughter presents with her today and aids in HPI.  Her granddaughter reports that since the decreased dose, she has not noticed the rolling motions of her mouth.  She reports that there are times that she notices Stephanie Gould smacking her lips or tapping her feet.  She has noticed that these events are specifically when someone is talking to Stephanie Gould or talking about her condition.  When Stephanie Gould is resting in undisturbed, she does not notice the symptoms as much.  Otherwise, Ms. Temples is doing well.  She is eating and drinking normally.  No new behavioral changes.  She is tolerating Aricept.  HISTORY: (copied from my note on 06/30/2019)  Stephanie Gould is a 79 y.o. female here today for follow up. She presents with her granddaughter who aids in history. She is fairly stable. Memory hasn't improved or worsened. Family has noticed for the past 6 months that she is making repetitive movements with lips and tongue. She is rolling lips under and holding with her teeth. She is constantly moving her legs. It is like she is dancing siting down. She sometimes rubs her feet and complains of knee pain but denies any pain in the office today. She was not able to tolerate increased dose of Aricept and is now taking 5mg  at bedtime. She is followed closely by PCP. She was recently started on Ativan 1mg  twice daily as needed for  agitation. Granddaughter is uncertain if this has helped much but no worsening.   HISTORY: (copied from Dr ' note on 01/12/2019)  Stephanie Lindsayis a 84 year old right-handed black female with a history of a progressive memory disturbance for least 1 year. The patient was living alone, by August 2019, the patient was noted to be wandering outside the house, she was brought to the emergency room for an evaluation. She has required aidand assistance in the home environment since that time, she lives with her granddaughter on weekends. She is no longer able to keep up with her medications or appointments, she does not operate a motor vehicle and never has. She requires it assistance with keeping up with finances, prior to the August hospitalization she was not paying her bills properly and was not taking her medication and was not going for her doctor's visits. The patient does have problems with some agitation on a regular basis, she will sundown at times. She was seeing children playing in the backyard who were not there. At nighttime, she may get up in the middle the night and wander about the house. The family keeps the door locks that she cannot wander outside of the house. The patient does at times require some assistance with bathing and dressing. She is on alprazolam 1 mg twice a day if needed, this is no longer as effective as it was for agitation. She takes Zyprexa 10 mg at night. She is on low-dose donepezil  taking 5 mg at bedtime. She seemed to tolerate the Aricept fairly well. She is sent to this office for an evaluation. The family reports that she is falling some. The patient does report some numbness in the feet, she may have a bit of a neuropathy.   REVIEW OF SYSTEMS: Out of a complete 14 system review of symptoms, the patient complains only of the following symptoms, memory loss, numbness, weakness and all other reviewed systems are negative.  ALLERGIES: No Known  Allergies  HOME MEDICATIONS: Outpatient Medications Prior to Visit  Medication Sig Dispense Refill   amLODipine-benazepril (LOTREL) 5-20 MG capsule Take 1 capsule by mouth daily.     donepezil (ARICEPT) 10 MG tablet Take 0.5 tablets (5 mg total) by mouth at bedtime. 30 tablet 5   glipiZIDE (GLUCOTROL XL) 10 MG 24 hr tablet Take 10 mg by mouth daily with breakfast.     LORazepam (ATIVAN) 1 MG tablet TK 1 T PO BID UTD     rosuvastatin (CRESTOR) 5 MG tablet Take 5 mg by mouth daily.     OLANZapine (ZYPREXA) 7.5 MG tablet Take 1 tablet (7.5 mg total) by mouth at bedtime. 30 tablet 1   No facility-administered medications prior to visit.    PAST MEDICAL HISTORY: Past Medical History:  Diagnosis Date   Acute encephalopathy    Allergic rhinosinusitis    Diabetes mellitus    Elevated troponin    Hypertension    Hypokalemia    Obesity     PAST SURGICAL HISTORY: Past Surgical History:  Procedure Laterality Date   FOOT SURGERY      FAMILY HISTORY: Family History  Problem Relation Age of Onset   Dementia Mother    Diverticulitis Sister    Dementia Brother     SOCIAL HISTORY: Social History   Socioeconomic History   Marital status: Divorced    Spouse name: Not on file   Number of children: Not on file   Years of education: Not on file   Highest education level: Not on file  Occupational History   Not on file  Tobacco Use   Smoking status: Never Smoker   Smokeless tobacco: Never Used  Substance and Sexual Activity   Alcohol use: No   Drug use: No   Sexual activity: Not on file  Other Topics Concern   Not on file  Social History Narrative   Not on file   Social Determinants of Health   Financial Resource Strain:    Difficulty of Paying Living Expenses: Not on file  Food Insecurity:    Worried About Programme researcher, broadcasting/film/video in the Last Year: Not on file   [employer] of Food in the Last Year: Not on file  Transportation Needs:    Lack of  Transportation (Medical): Not on file   Lack of Transportation (Non-Medical): Not on file  Physical Activity:    Days of Exercise per Week: Not on file   Minutes of Exercise per Session: Not on file  Stress:    Feeling of Stress : Not on file  Social Connections:    Frequency of Communication with Friends and Family: Not on file   Frequency of Social Gatherings with Friends and Family: Not on file   Attends Religious Services: Not on file   Active Member of Clubs or Organizations: Not on file   Attends Banker Meetings: Not on file   Marital Status: Not on file  Intimate Partner Violence:    Fear  of Current or Ex-Partner: Not on file   Emotionally Abused: Not on file   Physically Abused: Not on file   Sexually Abused: Not on file      PHYSICAL EXAM  Vitals:   09/15/19 0802  BP: 126/72  Pulse: (!) 59  Temp: (!) 97.1 F (36.2 C)  TempSrc: Oral  Weight: 162 lb 9.6 oz (73.8 kg)  Height: 5\' 5"  (1.651 m)   Body mass index is 27.06 kg/m.  Generalized: Well developed, in no acute distress  Cardiology: normal rate and rhythm, no murmur noted Respiratory: Clear to auscultation bilaterally Neurological examination  Mentation: Alert, patient is not oriented to time, place, or history taking. Follows all commands speech and language fluent Cranial nerve II-XII: Pupils were equal round reactive to light. Extraocular movements were full, visual field were full on confrontational test. Facial sensation and strength were normal. Uvula tongue midline. Head turning and shoulder shrug  were normal and symmetric. Motor: The motor testing reveals 5 over 5 strength of all 4 extremities. Good symmetric motor tone is noted throughout.  During interview, patient was noted to shuffle her feet for just a few seconds then stop, no obvious lip rolling, smacking or continuous shuffling of her feet as in prior visit. Sensory: Sensory testing is intact to soft touch on all 4  extremities. No evidence of extinction is noted.  Coordination: Cerebellar testing reveals good finger-nose-finger and heel-to-shin bilaterally.  Gait and station: Gait is short, stable. Tandem not attempted.   DIAGNOSTIC DATA (LABS, IMAGING, TESTING) - I reviewed patient records, labs, notes, testing and imaging myself where available.  MMSE - Mini Mental State Exam 06/30/2019  Not completed: Unable to complete     Lab Results  Component Value Date   WBC 4.0 04/23/2018   HGB 11.6 (L) 04/23/2018   HCT 35.4 (L) 04/23/2018   MCV 93.9 04/23/2018   PLT 241 04/23/2018      Component Value Date/Time   NA 142 04/23/2018 0711   K 4.0 04/23/2018 0711   CL 107 04/23/2018 0711   CO2 29 04/23/2018 0711   GLUCOSE 119 (H) 04/23/2018 0711   BUN 15 04/23/2018 0711   CREATININE 0.85 04/23/2018 0711   CALCIUM 9.3 04/23/2018 0711   CALCIUM 9.0 11/09/2011 0500   PROT 6.9 04/22/2018 0518   ALBUMIN 3.6 04/22/2018 0518   AST 23 04/22/2018 0518   ALT 18 04/22/2018 0518   ALKPHOS 55 04/22/2018 0518   BILITOT 0.7 04/22/2018 0518   GFRNONAA >60 04/23/2018 0711   GFRAA >60 04/23/2018 0711   No results found for: CHOL, HDL, LDLCALC, LDLDIRECT, TRIG, CHOLHDL Lab Results  Component Value Date   HGBA1C 9.2 (H) 04/21/2018   No results found for: VITAMINB12 Lab Results  Component Value Date   TSH 4.642 (H) 04/21/2018       ASSESSMENT AND PLAN 79 y.o. year old female  has a past medical history of Acute encephalopathy, Allergic rhinosinusitis, Diabetes mellitus, Elevated troponin, Hypertension, Hypokalemia, and Obesity. here with     ICD-10-CM   1. Late onset Alzheimer's disease with behavioral disturbance (HCC)  G30.1    F02.81   2. Tardive dyskinesia  G24.01     Ms Pizzimenti appears to be doing somewhat better since her last visit with Lillia Abed in October.  She has tolerated the decrease dose and Zyprexa.  Family reports that previous symptoms seem to have improved and patient is now only  demonstrating curious behaviors when she is spoken to or if  she knows someone is talking to her.  I have asked that they continue to monitor her very closely at home.  They will pay particular attention to timing of symptoms.  We will decrease Zyprexa to 5 mg daily.  We will continue Aricept as prescribed.  Adequate hydration, well-balanced diet and regular physical activity advised.  She will follow-up in 6 to 8 weeks with Dr. Jannifer Franklin for continued evaluation.  Her granddaughter verbalizes understanding and agreement with this plan.   No orders of the defined types were placed in this encounter.    Meds ordered this encounter  Medications   OLANZapine (ZYPREXA) 5 MG tablet    Sig: Take 1 tablet (5 mg total) by mouth at bedtime.    Dispense:  30 tablet    Refill:  2    Order Specific Question:   Supervising Provider    Answer:   Melvenia Beam [id number]      I spent 20 minutes with the patient. 50% of this time was spent counseling and educating patient on plan of care and medications.    Debbora Presto, FNP-C 09/15/2019, 8:52 AM [facility] [address], [place], [place] [address] [phone]

## 2019-09-15 ENCOUNTER — Ambulatory Visit (INDEPENDENT_AMBULATORY_CARE_PROVIDER_SITE_OTHER): Payer: Medicare Other | Admitting: Family Medicine

## 2019-09-15 ENCOUNTER — Other Ambulatory Visit: Payer: Self-pay

## 2019-09-15 ENCOUNTER — Ambulatory Visit: Payer: Medicare Other | Admitting: Neurology

## 2019-09-15 ENCOUNTER — Encounter: Payer: Self-pay | Admitting: Family Medicine

## 2019-09-15 VITALS — BP 126/72 | HR 59 | Temp 97.1°F | Ht 65.0 in | Wt 162.6 lb

## 2019-09-15 DIAGNOSIS — F0281 Dementia in other diseases classified elsewhere with behavioral disturbance: Secondary | ICD-10-CM | POA: Diagnosis not present

## 2019-09-15 DIAGNOSIS — G2401 Drug induced subacute dyskinesia: Secondary | ICD-10-CM | POA: Diagnosis not present

## 2019-09-15 DIAGNOSIS — G301 Alzheimer's disease with late onset: Secondary | ICD-10-CM | POA: Diagnosis not present

## 2019-09-15 DIAGNOSIS — F02818 Dementia in other diseases classified elsewhere, unspecified severity, with other behavioral disturbance: Secondary | ICD-10-CM

## 2019-09-15 MED ORDER — OLANZAPINE 5 MG PO TABS: 5.0000 mg | ORAL_TABLET | Freq: Every day | ORAL | 2 refills | Status: DC

## 2019-09-15 NOTE — Patient Instructions (Addendum)
Please decrease Zyprexa to 5mg  daily. Continue Aricept as prescribed   Follow up with Dr Jannifer Franklin in 6-8 weeks   Tardive Dyskinesia Tardive dyskinesia is a disorder that causes uncontrollable body movements. It occurs in some people who are taking certain medicines to treat a mental illness (neuroleptic medicine) or have taken this type of medicine in the past. These medicines block the effects of a specific brain chemical (dopamine). Sometimes, tardive dyskinesia starts months or years after someone took the medicine. Not everyone who takes a neuroleptic medicine will get tardive dyskinesia. What are the causes? This condition is caused by changes in your brain that are associated with taking a neuroleptic medicine. What increases the risk? If you are taking a neuroleptic medicine, your risk for tardive dyskinesia may be higher if you:  Are taking an older type of neuroleptic medicine.  Have been taking the medicine for a long time at a high dose.  Are a woman past the age of menopause.  Are older than 60.  Have a history of alcohol or drug abuse. What are the signs or symptoms? Abnormal, uncontrollable movements are the main symptom of tardive dyskinesia. These types of movements may include:  Grimacing.  Sticking out or twisting your tongue.  Making chewing or sucking sounds.  Making grunting or sighing noises.  Blinking your eyes.  Twisting, swaying, or thrusting your body.  Foot tapping or finger waving.  Rapid movements of your arms or legs. How is this diagnosed? Your health care provider may suspect that you have tardive dyskinesia if:  You have been taking neuroleptic medicines.  You have abnormal movements that you cannot control. If you are taking a medicine that can cause tardive dyskinesia, your health care provider may screen you for early signs of the condition. This may include:  Observing your body movements.  Using a specific rating scale called the  Abnormal Involuntary Movement Scale (AIMS). You may also have tests to rule out other conditions that cause abnormal body movements, including:  Parkinson's disease.  Huntington's disease.  Stroke. How is this treated? The best treatment for tardive dyskinesia is to lower the dose of your medicine or to switch to a different medicine at the first sign of abnormal and uncontrolled movements. There is no cure for long-term (chronic) tardive dyskinesia. Some medicines may help control the movements. These include:  Clozapine, a medicine used to treat mental illness (antipsychotic).  Some muscle relaxants.  Some anti-seizure medicines.  Some medicines used to treat high blood pressure.  Some tranquilizers (sedatives). Follow these instructions at home:      Take over-the-counter and prescription medicines only as told by your health care provider. Do not stop or start taking any medicines without talking to your health care provider first.  Do not abuse drugs or alcohol.  Keep all follow-up visits as told by your health care provider. This is important. Contact a health care provider if:  You are unable to eat or drink.  You have had a fall.  Your symptoms get worse. Summary  Tardive dyskinesia is a disorder that causes uncontrollable body movements. These may include grimacing, sticking out or twisting your tongue, blinking your eyes, or rapid movements of your arms or legs.  The condition occurs in some people who are taking certain medicines to treat a mental illness (neuroleptic medicine) or have taken this type of medicine in the past.  The best treatment for tardive dyskinesia is to lower the dose of your medicine or  to switch to a different medicine at the first sign of abnormal and uncontrolled movements.  There is no cure for long-term (chronic) tardive dyskinesia, but some medicines may help control the movements. This information is not intended to replace advice  given to you by your health care provider. Make sure you discuss any questions you have with your health care provider. Document Revised: 09/18/2017 Document Reviewed: 09/18/2017 Elsevier Patient Education  2020 [employer].

## 2019-09-18 NOTE — Progress Notes (Signed)
I have read the note, and I agree with the clinical assessment and plan.  Shera Laubach K Abaigeal Moomaw   

## 2019-10-21 ENCOUNTER — Ambulatory Visit
Admission: RE | Admit: 2019-10-21 | Discharge: 2019-10-21 | Disposition: A | Discharge status: Home / Self Care | Payer: PRIVATE HEALTH INSURANCE | Source: Ambulatory Visit | Attending: Internal Medicine | Admitting: Internal Medicine

## 2019-10-21 ENCOUNTER — Other Ambulatory Visit: Payer: Self-pay | Admitting: Internal Medicine

## 2019-10-21 DIAGNOSIS — W19XXXA Unspecified fall, initial encounter: Secondary | ICD-10-CM

## 2019-11-08 ENCOUNTER — Ambulatory Visit: Payer: Medicare Other | Attending: Internal Medicine

## 2019-11-08 DIAGNOSIS — Z23 Encounter for immunization: Secondary | ICD-10-CM | POA: Insufficient documentation

## 2019-11-08 NOTE — Progress Notes (Signed)
   Covid-19 Vaccination Clinic  Name:  Stephanie Gould    MRN: [id number] DOB: [birth year]  11/08/2019  [name] was observed post Covid-19 immunization for 15 minutes without incidence. She was provided with Vaccine Information Sheet and instruction to access the V-Safe system.   [name] was instructed to call 911 with any severe reactions post vaccine: Marland Kitchen Difficulty breathing  . Swelling of your face and throat  . A fast heartbeat  . A bad rash all over your body  . Dizziness and weakness    Immunizations Administered    Name Date Dose VIS Date Route   Pfizer COVID-19 Vaccine 11/08/2019 10:08 AM 0.3 mL 08/20/2019 Intramuscular   Manufacturer: [employer], [employer]   Lot: [id number]   NDC: [address]-0

## 2019-11-09 ENCOUNTER — Encounter: Payer: Self-pay | Admitting: Neurology

## 2019-11-09 ENCOUNTER — Telehealth: Payer: Self-pay | Admitting: Neurology

## 2019-11-09 ENCOUNTER — Ambulatory Visit (INDEPENDENT_AMBULATORY_CARE_PROVIDER_SITE_OTHER): Payer: Medicare Other | Admitting: Neurology

## 2019-11-09 ENCOUNTER — Other Ambulatory Visit: Payer: Self-pay

## 2019-11-09 VITALS — BP 159/90 | HR 88 | Ht 65.0 in | Wt 159.0 lb

## 2019-11-09 DIAGNOSIS — E538 Deficiency of other specified B group vitamins: Secondary | ICD-10-CM

## 2019-11-09 DIAGNOSIS — R202 Paresthesia of skin: Secondary | ICD-10-CM

## 2019-11-09 MED ORDER — OLANZAPINE 2.5 MG PO TABS: ORAL_TABLET | ORAL | 0 refills | Status: DC

## 2019-11-09 NOTE — Progress Notes (Signed)
Reason for visit: Gait disorder, dementia, tardive dyskinesia  Stephanie Gould is an 79 y.o. female  History of present illness:  Stephanie Gould is a 75 year old right-handed black female with a history of significant dementia.  The patient had been on Zyprexa 10 mg for agitation.  The patient has been noted to have a shuffling type gait, she was felt to potentially have some parkinsonian features.  She also has a history of diabetes and has reported some numbness in the feet.  She will fall on occasion, she has decreased arm swing.  On Zyprexa 10 mg she developed some tardive dyskinesia, slowly the Zyprexa has been tapered down to 5 mg daily, the tardive dyskinesia is still there but is not severe.  The patient will sometimes pocket food in the mouth, she will chew her tablets.  She is restless, but generally at nighttime she sleeps fairly well.  She does not have severe episodes of agitation currently.  Past Medical History:  Diagnosis Date  . Acute encephalopathy   . Allergic rhinosinusitis   . Diabetes mellitus   . Elevated troponin   . Hypertension   . Hypokalemia   . Obesity     Past Surgical History:  Procedure Laterality Date  . FOOT SURGERY      Family History  Problem Relation Age of Onset  . Dementia Mother   . Diverticulitis Sister   . Dementia Brother     Social history:  reports that she has never smoked. She has never used smokeless tobacco. She reports that she does not drink alcohol or use drugs.   No Known Allergies  Medications:  Prior to Admission medications   Medication Sig Start Date End Date Taking? Authorizing Provider  amLODipine-benazepril (LOTREL) 5-20 MG capsule Take 1 capsule by mouth daily.   Yes [provider]  donepezil (ARICEPT) 10 MG tablet Take 0.5 tablets (5 mg total) by mouth at bedtime. 06/30/19  Yes Lomax, Amy, NP  glipiZIDE (GLUCOTROL XL) 10 MG 24 hr tablet Take 10 mg by mouth daily with breakfast.   Yes [provider]  LORazepam (ATIVAN) 1 MG tablet TK 1 T PO BID UTD 06/21/19  Yes [provider]  rosuvastatin (CRESTOR) 5 MG tablet Take 5 mg by mouth daily.   Yes [provider]  OLANZapine (ZYPREXA) 2.5 MG tablet One tablet daily for 6 weeks, then stop 11/09/19   York Spaniel, MD    ROS:  Out of a complete 14 system review of symptoms, the patient complains only of the following symptoms, and all other reviewed systems are negative.  Memory disturbance Walking problems  Blood pressure (!) 159/90, pulse 88, height 5\' 5"  (1.651 m), weight 159 lb (72.1 kg).  Physical Exam  General: The patient is alert and cooperative at the time of the examination.  The patient has decreased verbal capacity.  Skin: No significant peripheral edema is noted.   Neurologic Exam  Mental status: The patient is alert and oriented x 1 at the time of the examination (not oriented to place or date). The Mini-Mental status examination done today shows a total score of 3/30.   Cranial nerves: Facial symmetry is present. Speech is normal, no aphasia or dysarthria is noted. Extraocular movements are full. Visual fields are full to threat.  Motor: The patient has good strength in all 4 extremities.  Sensory examination: Soft touch sensation is symmetric on the face, arms, and legs.  Coordination: The patient has good finger-nose-finger  and heel-to-shin bilaterally.  Gait and station: The patient has ability to walk independently but she walks with a knees partially flexed, shuffling gait is noted, decreased arm swing.  Reflexes: Deep tendon reflexes are symmetric.   Assessment/Plan:  1.  Severe dementia  2.  Gait disorder, some features of parkinsonism  3.  History of diabetes, numbness of the feet  4.  Tardive dyskinesia on Zyprexa  The Zyprexa will be lowered again to 2.5 mg for 6 weeks and then stop the medication.  If agitation worsens will consider use of Seroquel or Nuplazid.  The  patient has a shuffling type gait that could be in part related to the Zyprexa.  Hopefully the walking will improve with cessation of this medication.  She will follow-up here in 4 months.  Jill Alexanders MD 11/09/2019 3:52 PM  [facility] [address] [place], [place] [address]  Phone [phone] Fax [phone]

## 2019-11-09 NOTE — Telephone Encounter (Signed)
This patient did not show for a revisit appointment today. 

## 2019-11-15 LAB — MULTIPLE MYELOMA PANEL, SERUM
Albumin SerPl Elph-Mcnc: 4.3 g/dL (ref 2.9–4.4)
Albumin/Glob SerPl: 1.4 (ref 0.7–1.7)
Alpha 1: 0.3 g/dL (ref 0.0–0.4)
Alpha2 Glob SerPl Elph-Mcnc: 0.7 g/dL (ref 0.4–1.0)
B-Globulin SerPl Elph-Mcnc: 1 g/dL (ref 0.7–1.3)
Gamma Glob SerPl Elph-Mcnc: 1.2 g/dL (ref 0.4–1.8)
Globulin, Total: 3.1 g/dL (ref 2.2–3.9)
IgA/Immunoglobulin A, Serum: 301 mg/dL (ref 64–422)
IgG (Immunoglobin G), Serum: 1312 mg/dL (ref 586–1602)
IgM (Immunoglobulin M), Srm: 118 mg/dL (ref 26–217)
Total Protein: 7.4 g/dL (ref 6.0–8.5)

## 2019-11-15 LAB — VITAMIN B12: Vitamin B-12: 849 pg/mL (ref 232–1245)

## 2019-11-15 LAB — B. BURGDORFI ANTIBODIES: Lyme IgG/IgM Ab: <0.91 {ISR} (ref 0.00–0.90)

## 2019-11-15 LAB — ANGIOTENSIN CONVERTING ENZYME: Angio Convert Enzyme: 8 U/L — ABNORMAL LOW (ref 14–82)

## 2019-11-15 LAB — COPPER, SERUM: Copper: 122 ug/dL (ref 80–158)

## 2019-11-15 LAB — ANA W/REFLEX: Anti Nuclear Antibody (ANA): NEGATIVE

## 2019-12-01 ENCOUNTER — Ambulatory Visit: Payer: Medicare Other | Attending: Internal Medicine

## 2019-12-01 DIAGNOSIS — Z23 Encounter for immunization: Secondary | ICD-10-CM

## 2019-12-01 NOTE — Progress Notes (Signed)
   Covid-19 Vaccination Clinic  Name:  Stephanie Gould    MRN: [id number] DOB: October 08, 1940  12/01/2019  [name] was observed post Covid-19 immunization for 15 minutes without incident. She was provided with Vaccine Information Sheet and instruction to access the V-Safe system.   [name] was instructed to call 911 with any severe reactions post vaccine: Marland Kitchen Difficulty breathing  . Swelling of face and throat  . A fast heartbeat  . A bad rash all over body  . Dizziness and weakness   Immunizations Administered    Name Date Dose VIS Date Route   Pfizer COVID-19 Vaccine 12/01/2019 10:50 AM 0.3 mL 08/20/2019 Intramuscular   Manufacturer: [employer], [employer]   Lot: [id number]   NDC: [address]-6

## 2020-03-14 ENCOUNTER — Ambulatory Visit: Payer: Medicare Other | Admitting: Family Medicine

## 2020-06-11 IMAGING — CR DG SKULL COMPLETE 4+V
6 series · 6 of 6 positions shown · non-contrast
Comparison: Bone window images from CT head 04/20/2018.

CLINICAL DATA: 78-year-old who fell recently and has bruising over
the LEFT eye with a likely subcutaneous hematoma on the forehead.

EXAM:
SKULL - COMPLETE 4 + VIEW

[[person_name] (1 of 4)]
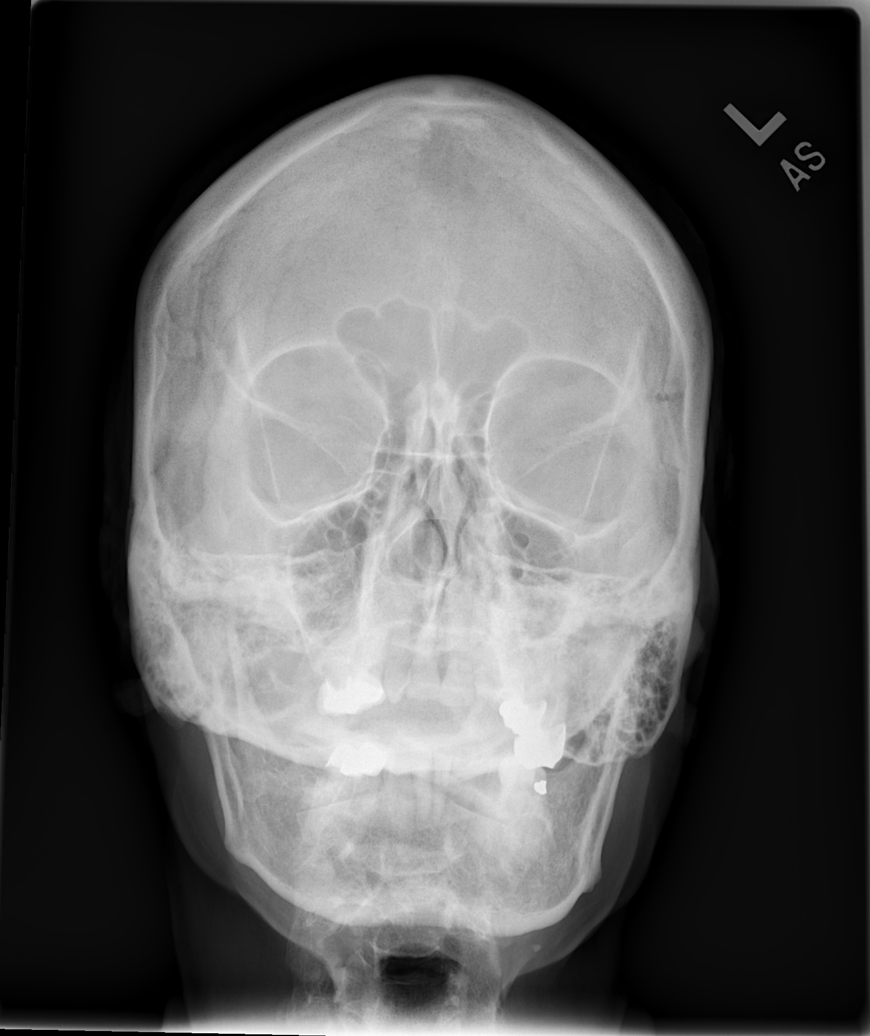

[[person_name] (2 of 4)]
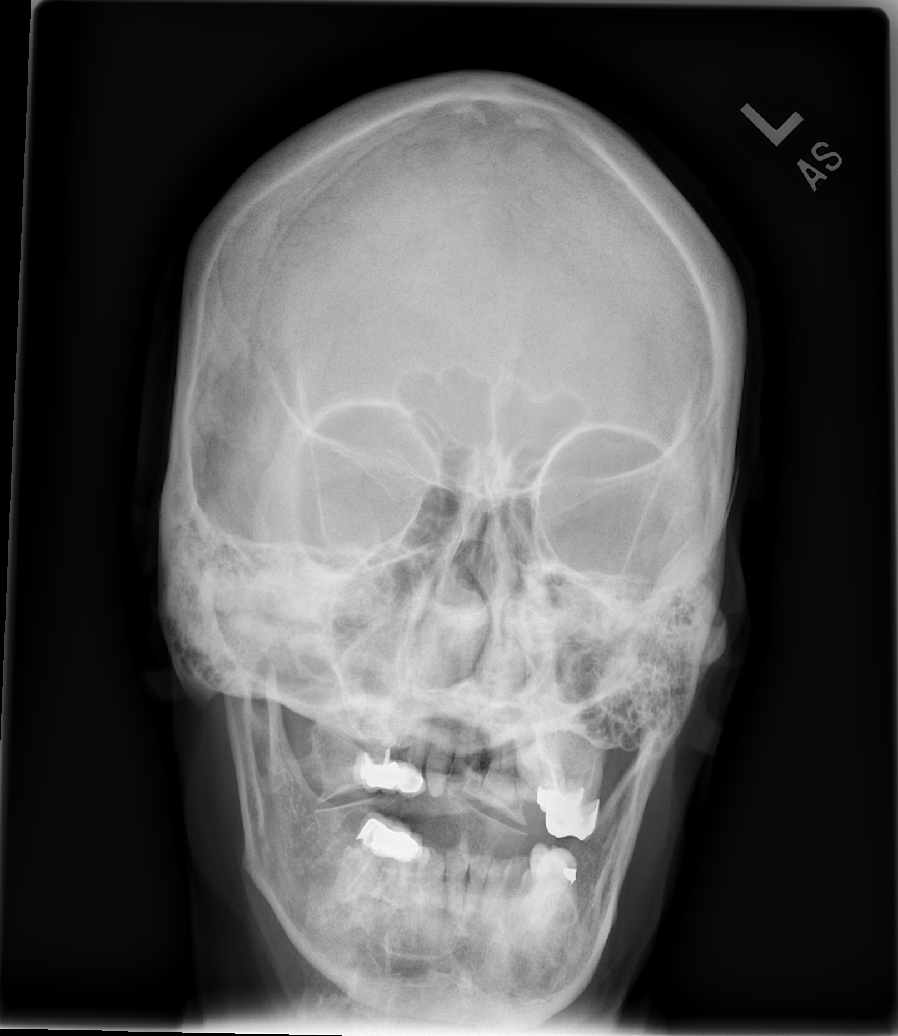

[[person_name] (3 of 4)]
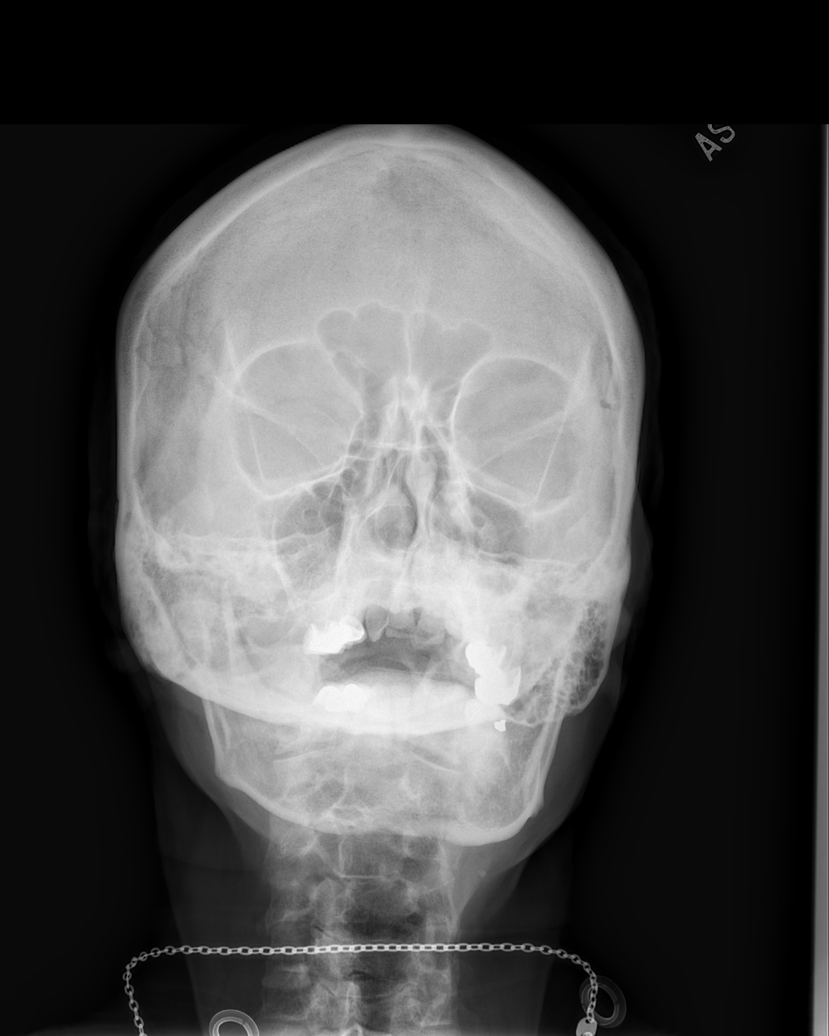

[w skull lat (1 of 2)]
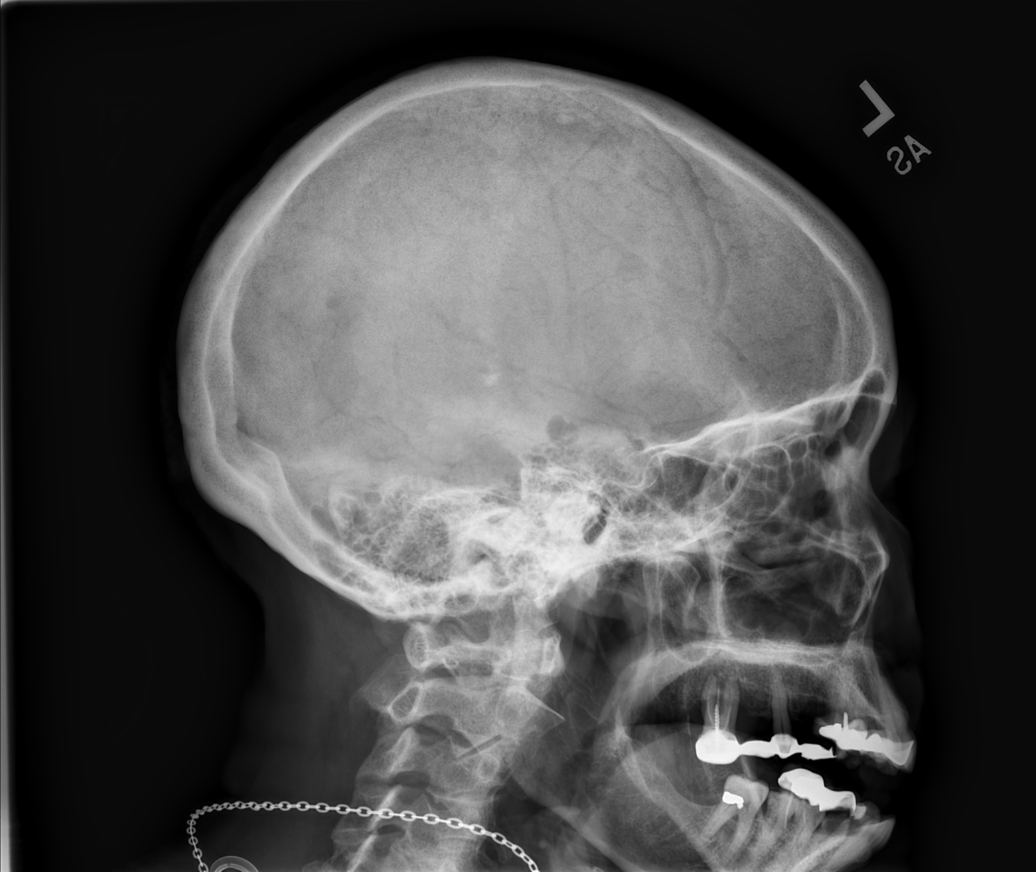

[w skull lat (2 of 2)]
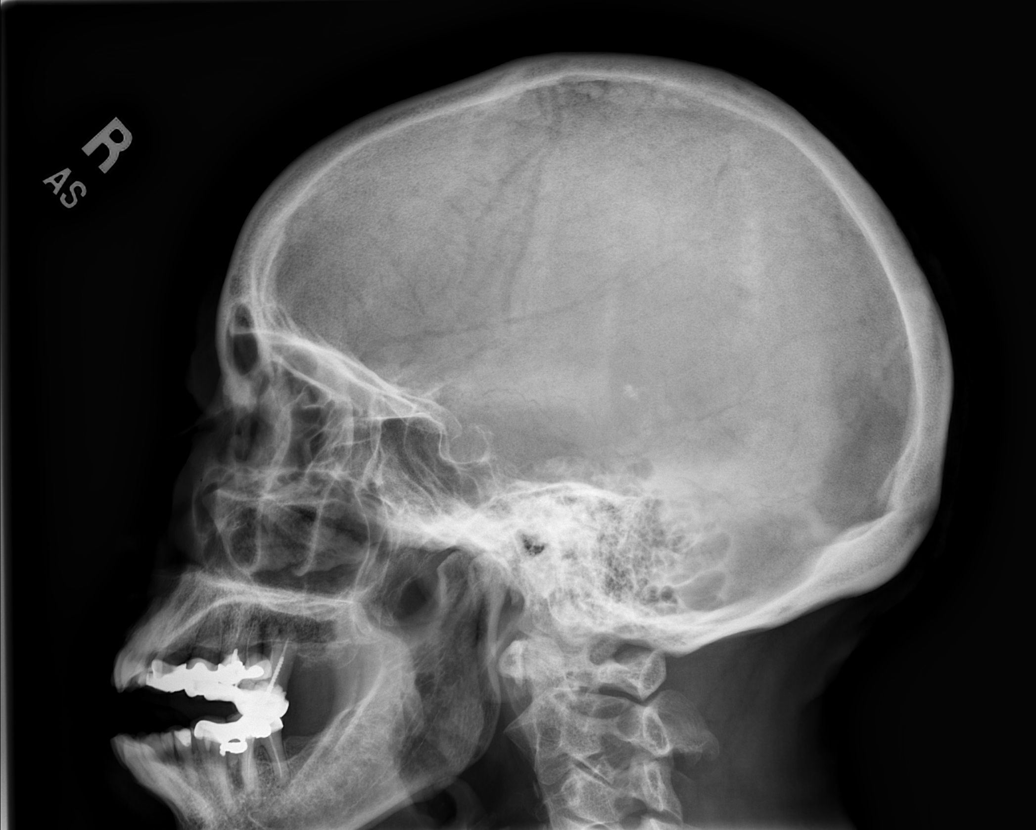

[[person_name] (4 of 4)]
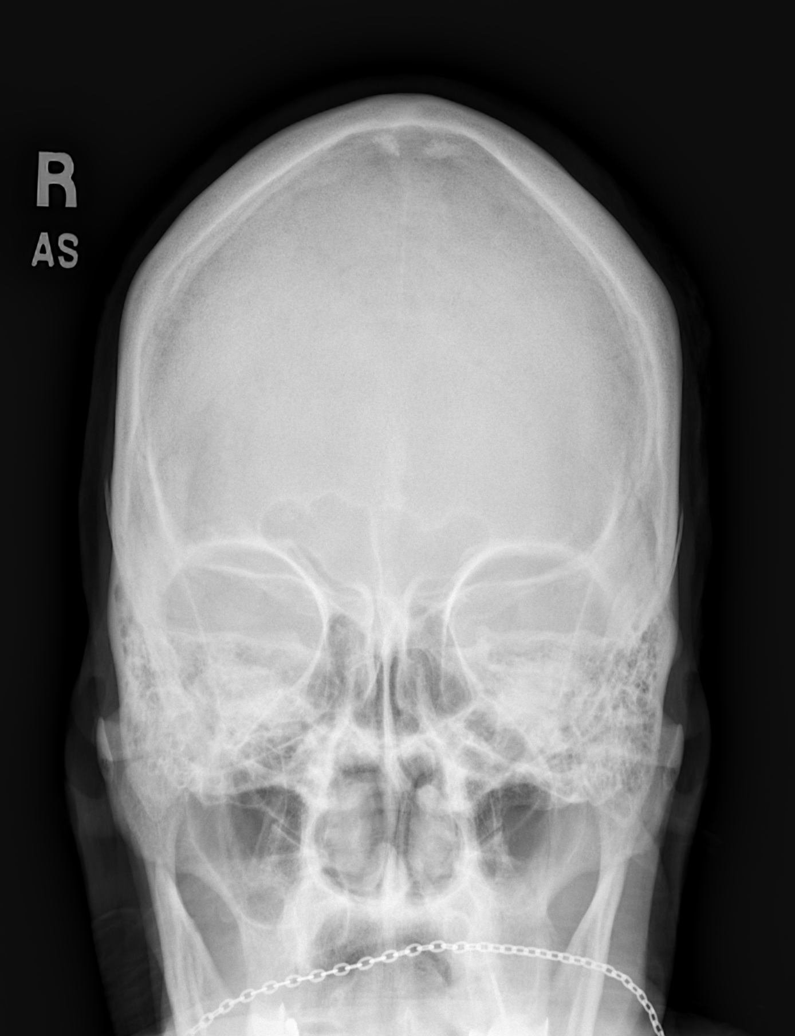

[6 of 6 positions shown; findings below may reference images not displayed]

FINDINGS: No skull fractures identified. No intrinsic osseous abnormalities.
Normal appearing sella. Visualized paranasal sinuses well aerated.
Bony nasal septal deviation to the LEFT.
IMPRESSION: No skull fractures identified.

## 2020-09-12 ENCOUNTER — Other Ambulatory Visit: Payer: Self-pay

## 2020-09-12 MED ORDER — DONEPEZIL HCL 10 MG PO TABS: 5.0000 mg | ORAL_TABLET | Freq: Every day | ORAL | 0 refills | Status: DC

## 2020-12-05 ENCOUNTER — Other Ambulatory Visit: Payer: Self-pay | Admitting: Neurology

## 2020-12-05 ENCOUNTER — Telehealth: Payer: Self-pay | Admitting: Neurology

## 2020-12-05 ENCOUNTER — Other Ambulatory Visit: Payer: Self-pay | Admitting: *Deleted

## 2020-12-05 DIAGNOSIS — F02818 Dementia in other diseases classified elsewhere, unspecified severity, with other behavioral disturbance: Secondary | ICD-10-CM

## 2020-12-05 DIAGNOSIS — F0281 Dementia in other diseases classified elsewhere with behavioral disturbance: Secondary | ICD-10-CM

## 2020-12-05 DIAGNOSIS — G301 Alzheimer's disease with late onset: Secondary | ICD-10-CM

## 2020-12-05 MED ORDER — DONEPEZIL HCL 10 MG PO TABS: 5.0000 mg | ORAL_TABLET | Freq: Every day | ORAL | 0 refills | Status: DC

## 2020-12-05 NOTE — Telephone Encounter (Signed)
Pt's daughter has scheduled pt's annual f/u she is asking for enough medication until appointment on 04-04  donepezil (ARICEPT) 10 MG tablet to [id number]

## 2020-12-12 ENCOUNTER — Ambulatory Visit: Payer: Medicare Other | Admitting: Family Medicine

## 2021-02-13 ENCOUNTER — Other Ambulatory Visit: Payer: Self-pay | Admitting: Emergency Medicine

## 2021-02-13 DIAGNOSIS — F02818 Dementia in other diseases classified elsewhere, unspecified severity, with other behavioral disturbance: Secondary | ICD-10-CM

## 2021-02-13 DIAGNOSIS — F0281 Dementia in other diseases classified elsewhere with behavioral disturbance: Secondary | ICD-10-CM

## 2021-02-13 MED ORDER — DONEPEZIL HCL 10 MG PO TABS: 5.0000 mg | ORAL_TABLET | Freq: Every day | ORAL | 0 refills | Status: DC

## 2021-03-19 ENCOUNTER — Ambulatory Visit: Payer: Medicare Other | Admitting: Family Medicine

## 2021-03-19 ENCOUNTER — Telehealth: Payer: Self-pay | Admitting: Family Medicine

## 2021-03-19 DIAGNOSIS — F0281 Dementia in other diseases classified elsewhere with behavioral disturbance: Secondary | ICD-10-CM

## 2021-03-19 DIAGNOSIS — F02818 Dementia in other diseases classified elsewhere, unspecified severity, with other behavioral disturbance: Secondary | ICD-10-CM

## 2021-03-19 NOTE — Telephone Encounter (Signed)
Patient was supposed to see Amy today and her appointment was cancelled because Amy is sick. They are going to call back later today to reschedule when they can coordinate with her ride. Patient asked if her medications can be refilled still today.

## 2021-03-26 MED ORDER — DONEPEZIL HCL 10 MG PO TABS: 5.0000 mg | ORAL_TABLET | Freq: Every day | ORAL | 0 refills | Status: DC

## 2021-03-26 NOTE — Addendum Note (Signed)
Addended by: Judi Cong on: 03/26/2021 04:02 PM   Modules accepted: Orders

## 2021-03-26 NOTE — Telephone Encounter (Signed)
Pt is off the zyprexa and only needs refills for the donepezil. Refill sent to the pharmacy for the patient. Pt also scheduled 05/16/2021 for her follow up visit

## 2021-03-26 NOTE — Addendum Note (Signed)
Addended byNicholas Lose, Latashia Koch L on: 03/26/2021 02:08 PM   Modules accepted: Orders

## 2021-05-15 NOTE — Progress Notes (Signed)
PATIENT: Stephanie Gould DOB: April 13, 1941  REASON FOR VISIT: follow up HISTORY FROM: patient  Chief Complaint  Patient presents with   Follow-up    New room. Last seen 11/09/2019. Needing refill on donepezil. Starting to get a little more aggressive at bedtime. Tried doing repeat MMSE but was unable. She gets agitated easily. Stephanie Gould, grandson, assists with history, today.       HISTORY OF PRESENT ILLNESS:  05/16/2021 ALL: Stephanie Gould returns for follow up for AD. She continues donepezil 5mg  at bedtime. Previously, she has not been able to tolerate increased dose. She has been off Zyprexa for about a year. Abnormal lip movements have resolved. She does occasionally scratch at her leg but, otherwise, doing fairly well. She lives with her daughter and granddaughter. Her grandson, Stephanie Gould helps as well. She requires near total assistance with ADLs. She ambulates fairly well. No falls. Balance seems good but she is slow. She is eating normally. She sleeps failry well but does get more agitated at night. Will occassionally wake at 3-4 am and can be restless. Bedtime usually around 9. She is usually fairly active around the home during the day.    09/15/2019 ALL:  Stephanie Gould is a 80 y.o. female here today for follow up for memory and concerns of tardive dyskinesia related to Zyprexa. Dose decreased to 7.5mg  in October.  Her granddaughter presents with her today and aids in HPI.  Her granddaughter reports that since the decreased dose, she has not noticed the rolling motions of her mouth.  She reports that there are times that she notices Stephanie. Stephanie Gould smacking her lips or tapping her feet.  She has noticed that these events are specifically when someone is talking to Stephanie. Stephanie Gould or talking about her condition.  When Stephanie. Stephanie Gould is resting in undisturbed, she does not notice the symptoms as much.  Otherwise, Stephanie. Stephanie Gould is doing well.  She is eating and drinking normally.  No new behavioral changes.  She is  tolerating Aricept.  HISTORY: (copied from my note on 06/30/2019)  Stephanie Gould is a 80 y.o. female here today for follow up. She presents with her granddaughter who aids in history. She is fairly stable. Memory hasn't improved or worsened. Family has noticed for the past 6 months that she is making repetitive movements with lips and tongue. She is rolling lips under and holding with her teeth. She is constantly moving her legs. It is like she is dancing siting down. She sometimes rubs her feet and complains of knee pain but denies any pain in the office today. She was not able to tolerate increased dose of Aricept and is now taking 5mg  at bedtime. She is followed closely by PCP. She was recently started on Ativan 1mg  twice daily as needed for agitation. Granddaughter is uncertain if this has helped much but no worsening.    HISTORY: (copied from Dr Anne HahnWillis' note on 01/12/2019)   Stephanie Gould is a 80 year old right-handed black female with a history of a progressive memory disturbance for least 1 year.  The patient was living alone, by August 2019, the patient was noted to be wandering outside the house, she was brought to the emergency room for an evaluation.  She has required aid and assistance in the home environment since that time, she lives with her granddaughter on weekends.  She is no longer able to keep up with her medications or appointments, she does not operate a motor vehicle and never has.  She requires  it assistance with keeping up with finances, prior to the August hospitalization she was not paying her bills properly and was not taking her medication and was not going for her doctor's visits.  The patient does have problems with some agitation on a regular basis, she will sundown at times.  She was seeing children playing in the backyard who were not there.  At nighttime, she may get up in the middle the night and wander about the house.  The family keeps the door locks that she cannot wander  outside of the house.  The patient does at times require some assistance with bathing and dressing.  She is on alprazolam 1 mg twice a day if needed, this is no longer as effective as it was for agitation.  She takes Zyprexa 10 mg at night.  She is on low-dose donepezil taking 5 mg at bedtime.  She seemed to tolerate the Aricept fairly well.  She is sent to this office for an evaluation.  The family reports that she is falling some.  The patient does report some numbness in the feet, she may have a bit of a neuropathy.   REVIEW OF SYSTEMS: Out of a complete 14 system review of symptoms, the patient complains only of the following symptoms, memory loss, numbness, weakness, restless, agitation, and all other reviewed systems are negative.  ALLERGIES: No Known Allergies  HOME MEDICATIONS: Outpatient Medications Prior to Visit  Medication Sig Dispense Refill   amLODipine-benazepril (LOTREL) 5-20 MG capsule Take 1 capsule by mouth daily.     glipiZIDE (GLUCOTROL XL) 10 MG 24 hr tablet Take 10 mg by mouth daily with breakfast.     LORazepam (ATIVAN) 1 MG tablet TK 1 T PO BID UTD     rosuvastatin (CRESTOR) 5 MG tablet Take 5 mg by mouth daily.     donepezil (ARICEPT) 10 MG tablet Take 0.5 tablets (5 mg total) by mouth at bedtime. Must be seen at follow up on 03/19/21 for further refills 45 tablet 0   No facility-administered medications prior to visit.    PAST MEDICAL HISTORY: Past Medical History:  Diagnosis Date   Acute encephalopathy    Allergic rhinosinusitis    Diabetes mellitus    Elevated troponin    Hypertension    Hypokalemia    Obesity     PAST SURGICAL HISTORY: Past Surgical History:  Procedure Laterality Date   FOOT SURGERY      FAMILY HISTORY: Family History  Problem Relation Age of Onset   Dementia Mother    Diverticulitis Sister    Dementia Brother     SOCIAL HISTORY: Social History   Socioeconomic History   Marital status: Divorced    Spouse name: Not on  file   Number of children: Not on file   Years of education: Not on file   Highest education level: Not on file  Occupational History   Not on file  Tobacco Use   Smoking status: Never   Smokeless tobacco: Never  Substance and Sexual Activity   Alcohol use: No   Drug use: No   Sexual activity: Not on file  Other Topics Concern   Not on file  Social History Narrative   Not on file   Social Determinants of Health   Financial Resource Strain: Not on file  Food Insecurity: Not on file  Transportation Needs: Not on file  Physical Activity: Not on file  Stress: Not on file  Social Connections: Not on file  Intimate Partner Violence: Not on file      PHYSICAL EXAM  Vitals:   05/16/21 0932  BP: 140/77  Pulse: 78  Weight: 137 lb 8 oz (62.4 kg)  Height: 5\' 5"  (1.651 m)    Body mass index is 22.88 kg/m.  Generalized: Well developed, in no acute distress  Cardiology: normal rate and rhythm, no murmur noted Respiratory: Clear to auscultation bilaterally Neurological examination  Mentation: Alert, patient is not oriented to time, place, or history taking. Follows limited commands speech and language fluent Cranial nerve II-XII: Pupils were equal round reactive to light. Extraocular movements were full, visual field were full on confrontational test.  Motor: The motor testing reveals 5 over 5 strength of all 4 extremities.  Sensory: Sensory testing is intact to soft touch on all 4 extremities. No evidence of extinction is noted.  Coordination: unable to follow instructions  Gait and station: Gait is short, and cautious, stable without assistive device. Decreased arm swing. Tandem not attempted.   DIAGNOSTIC DATA (LABS, IMAGING, TESTING) - I reviewed patient records, labs, notes, testing and imaging myself where available.  MMSE - Mini Mental State Exam 11/09/2019 06/30/2019  Not completed: - Unable to complete  Orientation to time 0 -  Orientation to Place 0 -   Registration 3 -  Attention/ Calculation 0 -  Recall 0 -  Language- name 2 objects 0 -  Language- repeat 0 -  Language- follow 3 step command 0 -  Language- read & follow direction 0 -  Write a sentence 0 -  Copy design 0 -  Total score 3 -     Lab Results  Component Value Date   WBC 4.0 04/23/2018   HGB 11.6 (L) 04/23/2018   HCT 35.4 (L) 04/23/2018   MCV 93.9 04/23/2018   PLT 241 04/23/2018      Component Value Date/Time   NA 142 04/23/2018 0711   K 4.0 04/23/2018 0711   CL 107 04/23/2018 0711   CO2 29 04/23/2018 0711   GLUCOSE 119 (H) 04/23/2018 0711   BUN 15 04/23/2018 0711   CREATININE 0.85 04/23/2018 0711   CALCIUM 9.3 04/23/2018 0711   CALCIUM 9.0 11/09/2011 0500   PROT 7.4 11/09/2019 1603   ALBUMIN 3.6 04/22/2018 0518   AST 23 04/22/2018 0518   ALT 18 04/22/2018 0518   ALKPHOS 55 04/22/2018 0518   BILITOT 0.7 04/22/2018 0518   GFRNONAA >60 04/23/2018 0711   GFRAA >60 04/23/2018 0711   No results found for: CHOL, HDL, LDLCALC, LDLDIRECT, TRIG, CHOLHDL Lab Results  Component Value Date   HGBA1C 9.2 (H) 04/21/2018   Lab Results  Component Value Date   VITAMINB12 849 11/09/2019   Lab Results  Component Value Date   TSH 4.642 (H) 04/21/2018       ASSESSMENT AND PLAN 80 y.o. year old female  has a past medical history of Acute encephalopathy, Allergic rhinosinusitis, Diabetes mellitus, Elevated troponin, Hypertension, Hypokalemia, and Obesity. here with     ICD-10-CM   1. Late onset Alzheimer's disease with behavioral disturbance (HCC)  G30.1 donepezil (ARICEPT) 10 MG tablet   F02.81     2. Tardive dyskinesia  G24.01        Stephanie Gould appears to be doing better since last visit with Dr Stephanie Abed 11/2019. She has discontinued Zyprexa and abnormal movements are nearly resolved. Memory continues to worsen. We will continue Aricept as prescribed. She does seem to have more agitation at night. We will start a  low dose of quetiapine 12.5-25mg  daily about  30 minutes before bedtime. Family will monitor closely for sedative side effects. Fall precautions discussed.  Adequate hydration, well-balanced diet and regular physical activity advised.  She will follow-up in 6 to 12 months. Her grandson verbalizes understanding and agreement with this plan.   No orders of the defined types were placed in this encounter.    Meds ordered this encounter  Medications   QUEtiapine (SEROQUEL) 25 MG tablet    Sig: Take 1 tablet (25 mg total) by mouth at bedtime. Give 1/2 tablet to 1 tablet 30 minutes before bedtime    Dispense:  90 tablet    Refill:  3    Order Specific Question:   Supervising Provider    Answer:   Anson Fret [[id number]]   donepezil (ARICEPT) 10 MG tablet    Sig: Take 0.5 tablets (5 mg total) by mouth at bedtime. Must be seen at follow up on 03/19/21 for further refills    Dispense:  45 tablet    Refill:  3    Order Specific Question:   Supervising Provider    Answer:   Anson Fret [[id number]]       OBS JGGEZ, FNP-C 05/16/2021, 10:22 AM [facility] [address], [address] [place], [place] [address] [phone]

## 2021-05-15 NOTE — Patient Instructions (Signed)
Below is our plan:  We will continue donepezil 5mg  daily. We will add Seroquel (quetiapine) 25mg  about 30 minutes before bedtime. Start with 1/2 tablet (12.5mg ) daily for 1-2 weeks then increase if needed. Please monitor closely for sedative side effects as discussed.   Please make sure you are staying well hydrated. I recommend 50-60 ounces daily. Well balanced diet and regular exercise encouraged. Consistent sleep schedule with 6-8 hours recommended.   Please continue follow up with care team as directed.   Follow up with me 6-12 months   You may receive a survey regarding today's visit. I encourage you to leave honest feed back as I do use this information to improve patient care. Thank you for seeing me today!     Management of Memory Problems   There are some general things you can do to help manage your memory problems.  Your memory may not in fact recover, but by using techniques and strategies you will be able to manage your memory difficulties better.   1)  Establish a routine. Try to establish and then stick to a regular routine.  By doing this, you will get used to what to expect and you will reduce the need to rely on your memory.  Also, try to do things at the same time of day, such as taking your medication or checking your calendar first thing in the morning. Think about think that you can do as a part of a regular routine and make a list.  Then enter them into a daily planner to remind you.  This will help you establish a routine.   2)  Organize your environment. Organize your environment so that it is uncluttered.  Decrease visual stimulation.  Place everyday items such as keys or cell phone in the same place every day (ie.  Basket next to front door) Use post it notes with a brief message to yourself (ie. Turn off light, lock the door) Use labels to indicate where things go (ie. Which cupboards are for food, dishes, etc.) Keep a notepad and pen by the telephone to take  messages   3)  Memory Aids A diary or journal/notebook/daily planner Making a list (shopping list, chore list, to do list that needs to be done) Using an alarm as a reminder (kitchen timer or cell phone alarm) Using cell phone to store information (Notes, Calendar, Reminders) Calendar/White board placed in a prominent position Post-it notes   In order for memory aids to be useful, you need to have good habits.  It's no good remembering to make a note in your journal if you don't remember to look in it.  Try setting aside a certain time of day to look in journal.   4)  Improving mood and managing fatigue. There may be other factors that contribute to memory difficulties.  Factors, such as anxiety, depression and tiredness can affect memory. Regular gentle exercise can help improve your mood and give you more energy. Simple relaxation techniques may help relieve symptoms of anxiety Try to get back to completing activities or hobbies you enjoyed doing in the past. Learn to pace yourself through activities to decrease fatigue. Find out about some local support groups where you can share experiences with others. Try and achieve 7-8 hours of sleep at night.

## 2021-05-16 ENCOUNTER — Other Ambulatory Visit: Payer: Self-pay

## 2021-05-16 ENCOUNTER — Encounter: Payer: Self-pay | Admitting: Family Medicine

## 2021-05-16 ENCOUNTER — Ambulatory Visit (INDEPENDENT_AMBULATORY_CARE_PROVIDER_SITE_OTHER): Payer: Medicare Other | Admitting: Family Medicine

## 2021-05-16 VITALS — BP 140/77 | HR 78 | Ht 65.0 in | Wt 137.5 lb

## 2021-05-16 DIAGNOSIS — F02818 Dementia in other diseases classified elsewhere, unspecified severity, with other behavioral disturbance: Secondary | ICD-10-CM

## 2021-05-16 DIAGNOSIS — F0281 Dementia in other diseases classified elsewhere with behavioral disturbance: Secondary | ICD-10-CM

## 2021-05-16 DIAGNOSIS — G2401 Drug induced subacute dyskinesia: Secondary | ICD-10-CM | POA: Diagnosis not present

## 2021-05-16 DIAGNOSIS — G301 Alzheimer's disease with late onset: Secondary | ICD-10-CM

## 2021-05-16 MED ORDER — DONEPEZIL HCL 10 MG PO TABS: 5.0000 mg | ORAL_TABLET | Freq: Every day | ORAL | 3 refills | Status: AC

## 2021-05-16 MED ORDER — QUETIAPINE FUMARATE 25 MG PO TABS: 25.0000 mg | ORAL_TABLET | Freq: Every day | ORAL | 3 refills | Status: DC

## 2021-05-16 NOTE — Progress Notes (Signed)
I have read the note, and I agree with the clinical assessment and plan.  Charlotte Brafford K Valentine Barney   

## 2021-06-11 ENCOUNTER — Emergency Department (HOSPITAL_BASED_OUTPATIENT_CLINIC_OR_DEPARTMENT_OTHER)
Admit: 2021-06-11 | Discharge: 2021-06-11 | Disposition: A | Discharge status: Home / Self Care | Payer: Medicare Other | Attending: Emergency Medicine | Admitting: Emergency Medicine

## 2021-06-11 ENCOUNTER — Encounter (HOSPITAL_COMMUNITY): Payer: Self-pay

## 2021-06-11 ENCOUNTER — Emergency Department (HOSPITAL_COMMUNITY)
Admission: EM | Admit: 2021-06-11 | Discharge: 2021-06-11 | Disposition: A | Discharge status: Home / Self Care | Payer: Medicare Other | Attending: Emergency Medicine | Admitting: Emergency Medicine

## 2021-06-11 DIAGNOSIS — Z79899 Other long term (current) drug therapy: Secondary | ICD-10-CM | POA: Insufficient documentation

## 2021-06-11 DIAGNOSIS — M79604 Pain in right leg: Secondary | ICD-10-CM | POA: Diagnosis not present

## 2021-06-11 DIAGNOSIS — I10 Essential (primary) hypertension: Secondary | ICD-10-CM | POA: Insufficient documentation

## 2021-06-11 DIAGNOSIS — F0390 Unspecified dementia without behavioral disturbance: Secondary | ICD-10-CM | POA: Diagnosis not present

## 2021-06-11 DIAGNOSIS — E119 Type 2 diabetes mellitus without complications: Secondary | ICD-10-CM | POA: Insufficient documentation

## 2021-06-11 DIAGNOSIS — Z7984 Long term (current) use of oral hypoglycemic drugs: Secondary | ICD-10-CM | POA: Diagnosis not present

## 2021-06-11 DIAGNOSIS — R600 Localized edema: Secondary | ICD-10-CM | POA: Insufficient documentation

## 2021-06-11 DIAGNOSIS — L0390 Cellulitis, unspecified: Secondary | ICD-10-CM

## 2021-06-11 DIAGNOSIS — M79605 Pain in left leg: Secondary | ICD-10-CM | POA: Diagnosis not present

## 2021-06-11 DIAGNOSIS — M7989 Other specified soft tissue disorders: Secondary | ICD-10-CM | POA: Diagnosis not present

## 2021-06-11 LAB — BASIC METABOLIC PANEL
Anion gap: 10 (ref 5–15)
BUN: 26 mg/dL — ABNORMAL HIGH (ref 8–23)
CO2: 24 mmol/L (ref 22–32)
Calcium: 9.2 mg/dL (ref 8.9–10.3)
Chloride: 105 mmol/L (ref 98–111)
Creatinine, Ser: 0.79 mg/dL (ref 0.44–1.00)
GFR, Estimated: >60 mL/min (ref >60)
Glucose, Bld: 72 mg/dL (ref 70–99)
Potassium: 4 mmol/L (ref 3.5–5.1)
Sodium: 139 mmol/L (ref 135–145)

## 2021-06-11 LAB — CBC WITH DIFFERENTIAL/PLATELET
Abs Immature Granulocytes: 0.03 10*3/uL (ref 0.00–0.07)
Basophils Absolute: 0 10*3/uL (ref 0.0–0.1)
Basophils Relative: 1 %
Eosinophils Absolute: 0.1 10*3/uL (ref 0.0–0.5)
Eosinophils Relative: 2 %
HCT: 35.7 % — ABNORMAL LOW (ref 36.0–46.0)
Hemoglobin: 11.4 g/dL — ABNORMAL LOW (ref 12.0–15.0)
Immature Granulocytes: 1 %
Lymphocytes Relative: 23 %
Lymphs Abs: 1.1 10*3/uL (ref 0.7–4.0)
MCH: 31.5 pg (ref 26.0–34.0)
MCHC: 31.9 g/dL (ref 30.0–36.0)
MCV: 98.6 fL (ref 80.0–100.0)
Monocytes Absolute: 0.4 10*3/uL (ref 0.1–1.0)
Monocytes Relative: 8 %
Neutro Abs: 2.9 10*3/uL (ref 1.7–7.7)
Neutrophils Relative %: 65 %
Platelets: 142 10*3/uL — ABNORMAL LOW (ref 150–400)
RBC: 3.62 MIL/uL — ABNORMAL LOW (ref 3.87–5.11)
RDW: 14.3 % (ref 11.5–15.5)
WBC: 4.5 10*3/uL (ref 4.0–10.5)
nRBC: 0 % (ref 0.0–0.2)

## 2021-06-11 LAB — BRAIN NATRIURETIC PEPTIDE: B Natriuretic Peptide: 23.7 pg/mL (ref 0.0–100.0)

## 2021-06-11 MED ORDER — DOXYCYCLINE HYCLATE 100 MG PO CAPS: 100.0000 mg | ORAL_CAPSULE | Freq: Two times a day (BID) | ORAL | 0 refills | Status: DC

## 2021-06-11 NOTE — Progress Notes (Signed)
Bilateral lower extremity venous duplex has been completed. Preliminary results can be found in CV Proc through chart review.  Results were given to Dr. Freida Busman.  06/11/21 1:21 PM Olen Cordial RVT

## 2021-06-11 NOTE — ED Notes (Signed)
Pt arrived to [facility] with out vital signs.  This nurse obtained vital signs with out issued.  Vitals updated upon arrival, daughter at bedside.

## 2021-06-11 NOTE — ED Notes (Signed)
Vascular study complete 

## 2021-06-11 NOTE — ED Provider Notes (Signed)
Emergency Medicine Provider Triage Evaluation Note  Stephanie Gould , a 80 y.o. female  was evaluated in triage.  Pt complains of bilateral lower extremity edema.  History is limited as patient has dementia and family member unsure of any complaints of pain.  Review of Systems  Positive: Lower extremity edema Negative: Vomiting  Physical Exam  Pulse 84   Resp 18  Gen:   Awake, no distress   Resp:  Normal effort  MSK:   Moves extremities without difficulty  Other:  Ambulating without difficulty  Medical Decision Making  Medically screening exam initiated at 12:08 PM.  Appropriate orders placed.  Ichelle Harral was informed that the remainder of the evaluation will be completed by another provider, this initial triage assessment does not replace that evaluation, and the importance of remaining in the ED until their evaluation is complete.  Labs ordered   Dietrich Pates, Cordelia Poche 06/11/21 1209    Wynetta Fines, MD 06/11/21 [phone]

## 2021-06-11 NOTE — ED Notes (Signed)
Pt to room 33 for Venus [place]

## 2021-06-11 NOTE — ED Triage Notes (Signed)
Pt arrived via POV, with family , per family pt bilateral legs painful and swollen. Pt with hx of dementia. Not cooperative with triage, unable to obtain full VS
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
## 2021-11-09 ENCOUNTER — Encounter (HOSPITAL_COMMUNITY): Payer: Self-pay

## 2021-11-09 ENCOUNTER — Inpatient Hospital Stay (HOSPITAL_COMMUNITY)
Admission: EM | Admit: 2021-11-09 | Discharge: 2021-11-11 | DRG: 916 | Disposition: A | Discharge status: Home / Self Care | Payer: Medicare Other | Attending: Family Medicine | Admitting: Family Medicine

## 2021-11-09 DIAGNOSIS — F0280 Dementia in other diseases classified elsewhere without behavioral disturbance: Secondary | ICD-10-CM | POA: Diagnosis present

## 2021-11-09 DIAGNOSIS — Z7984 Long term (current) use of oral hypoglycemic drugs: Secondary | ICD-10-CM | POA: Diagnosis not present

## 2021-11-09 DIAGNOSIS — Z79899 Other long term (current) drug therapy: Secondary | ICD-10-CM

## 2021-11-09 DIAGNOSIS — Z20822 Contact with and (suspected) exposure to covid-19: Secondary | ICD-10-CM | POA: Diagnosis present

## 2021-11-09 DIAGNOSIS — J309 Allergic rhinitis, unspecified: Secondary | ICD-10-CM | POA: Diagnosis present

## 2021-11-09 DIAGNOSIS — E785 Hyperlipidemia, unspecified: Secondary | ICD-10-CM | POA: Diagnosis present

## 2021-11-09 DIAGNOSIS — T464X5A Adverse effect of angiotensin-converting-enzyme inhibitors, initial encounter: Secondary | ICD-10-CM | POA: Diagnosis present

## 2021-11-09 DIAGNOSIS — G309 Alzheimer's disease, unspecified: Secondary | ICD-10-CM | POA: Diagnosis present

## 2021-11-09 DIAGNOSIS — T783XXA Angioneurotic edema, initial encounter: Principal | ICD-10-CM | POA: Diagnosis present

## 2021-11-09 DIAGNOSIS — I10 Essential (primary) hypertension: Secondary | ICD-10-CM | POA: Diagnosis present

## 2021-11-09 DIAGNOSIS — E119 Type 2 diabetes mellitus without complications: Secondary | ICD-10-CM | POA: Diagnosis present

## 2021-11-09 HISTORY — DX: Angioneurotic edema, initial encounter: T78.3XXA

## 2021-11-09 LAB — CBC WITH DIFFERENTIAL/PLATELET
Abs Immature Granulocytes: 0.03 10*3/uL (ref 0.00–0.07)
Basophils Absolute: 0 10*3/uL (ref 0.0–0.1)
Basophils Relative: 1 %
Eosinophils Absolute: 0.1 10*3/uL (ref 0.0–0.5)
Eosinophils Relative: 2 %
HCT: 36.7 % (ref 36.0–46.0)
Hemoglobin: 11.9 g/dL — ABNORMAL LOW (ref 12.0–15.0)
Immature Granulocytes: 1 %
Lymphocytes Relative: 24 %
Lymphs Abs: 1.1 10*3/uL (ref 0.7–4.0)
MCH: 31.9 pg (ref 26.0–34.0)
MCHC: 32.4 g/dL (ref 30.0–36.0)
MCV: 98.4 fL (ref 80.0–100.0)
Monocytes Absolute: 0.5 10*3/uL (ref 0.1–1.0)
Monocytes Relative: 11 %
Neutro Abs: 2.8 10*3/uL (ref 1.7–7.7)
Neutrophils Relative %: 61 %
Platelets: 165 10*3/uL (ref 150–400)
RBC: 3.73 MIL/uL — ABNORMAL LOW (ref 3.87–5.11)
RDW: 14.9 % (ref 11.5–15.5)
WBC: 4.6 10*3/uL (ref 4.0–10.5)
nRBC: 0 % (ref 0.0–0.2)

## 2021-11-09 LAB — COMPREHENSIVE METABOLIC PANEL
ALT: 16 U/L (ref 0–44)
AST: 25 U/L (ref 15–41)
Albumin: 4 g/dL (ref 3.5–5.0)
Alkaline Phosphatase: 61 U/L (ref 38–126)
Anion gap: 9 (ref 5–15)
BUN: 31 mg/dL — ABNORMAL HIGH (ref 8–23)
CO2: 24 mmol/L (ref 22–32)
Calcium: 9 mg/dL (ref 8.9–10.3)
Chloride: 105 mmol/L (ref 98–111)
Creatinine, Ser: 0.92 mg/dL (ref 0.44–1.00)
GFR, Estimated: >60 mL/min (ref >60)
Glucose, Bld: 102 mg/dL — ABNORMAL HIGH (ref 70–99)
Potassium: 4.5 mmol/L (ref 3.5–5.1)
Sodium: 138 mmol/L (ref 135–145)
Total Bilirubin: 0.7 mg/dL (ref 0.3–1.2)
Total Protein: 7.4 g/dL (ref 6.5–8.1)

## 2021-11-09 LAB — RESP PANEL BY RT-PCR (FLU A&B, COVID) ARPGX2
Influenza A by PCR: NEGATIVE
Influenza B by PCR: NEGATIVE
SARS Coronavirus 2 by RT PCR: NEGATIVE

## 2021-11-09 LAB — CBG MONITORING, ED: Glucose-Capillary: 137 mg/dL — ABNORMAL HIGH (ref 70–99)

## 2021-11-09 LAB — HEMOGLOBIN A1C
Hgb A1c MFr Bld: 5.8 % — ABNORMAL HIGH (ref 4.8–5.6)
Mean Plasma Glucose: 119.76 mg/dL

## 2021-11-09 MED ORDER — METHYLPREDNISOLONE SODIUM SUCC 125 MG IJ SOLR
125.0000 mg | Freq: Once | INTRAMUSCULAR | Status: AC
  Administered 2021-11-09: 125 mg via INTRAVENOUS
  Filled 2021-11-09: qty 2

## 2021-11-09 MED ORDER — METHYLPREDNISOLONE SODIUM SUCC 40 MG IJ SOLR
40.0000 mg | Freq: Two times a day (BID) | INTRAMUSCULAR | Status: DC
  Administered 2021-11-10: 40 mg via INTRAVENOUS
  Filled 2021-11-09 (×2): qty 1

## 2021-11-09 MED ORDER — QUETIAPINE FUMARATE 25 MG PO TABS
25.0000 mg | ORAL_TABLET | Freq: Every day | ORAL | Status: DC
  Administered 2021-11-09 – 2021-11-10 (×3): 25 mg via ORAL
  Filled 2021-11-09 (×3): qty 1

## 2021-11-09 MED ORDER — EPINEPHRINE 0.3 MG/0.3ML IJ SOAJ
0.3000 mg | Freq: Once | INTRAMUSCULAR | Status: AC
  Administered 2021-11-09: 0.3 mg via INTRAMUSCULAR
  Filled 2021-11-09: qty 0.3

## 2021-11-09 MED ORDER — HYDRALAZINE HCL 20 MG/ML IJ SOLN: 10.0000 mg | Freq: Three times a day (TID) | INTRAMUSCULAR | Status: DC | PRN

## 2021-11-09 MED ORDER — ROSUVASTATIN CALCIUM 5 MG PO TABS
5.0000 mg | ORAL_TABLET | Freq: Every day | ORAL | Status: DC
  Administered 2021-11-10: 5 mg via ORAL
  Filled 2021-11-09 (×2): qty 1

## 2021-11-09 MED ORDER — ONDANSETRON HCL 4 MG PO TABS
4.0000 mg | ORAL_TABLET | Freq: Four times a day (QID) | ORAL | Status: DC | PRN
  Filled 2021-11-09: qty 1

## 2021-11-09 MED ORDER — DIPHENHYDRAMINE HCL 50 MG/ML IJ SOLN
25.0000 mg | Freq: Three times a day (TID) | INTRAMUSCULAR | Status: DC
  Filled 2021-11-09: qty 1

## 2021-11-09 MED ORDER — FAMOTIDINE IN NACL 20-0.9 MG/50ML-% IV SOLN
20.0000 mg | Freq: Once | INTRAVENOUS | Status: AC
  Administered 2021-11-09: 20 mg via INTRAVENOUS
  Filled 2021-11-09: qty 50

## 2021-11-09 MED ORDER — SODIUM CHLORIDE 0.9 % IV SOLN
50.0000 mg | INTRAVENOUS | Status: DC | PRN
  Filled 2021-11-09: qty 1

## 2021-11-09 MED ORDER — ENOXAPARIN SODIUM 30 MG/0.3ML IJ SOSY
30.0000 mg | PREFILLED_SYRINGE | INTRAMUSCULAR | Status: DC
  Administered 2021-11-10: 30 mg via SUBCUTANEOUS
  Filled 2021-11-09: qty 0.3

## 2021-11-09 MED ORDER — LORAZEPAM 2 MG/ML IJ SOLN: 1.0000 mg | Freq: Once | INTRAMUSCULAR | Status: DC

## 2021-11-09 MED ORDER — SODIUM CHLORIDE 0.9 % IV SOLN: Freq: Once | INTRAVENOUS | Status: DC

## 2021-11-09 MED ORDER — INSULIN ASPART 100 UNIT/ML IJ SOLN
0.0000 [IU] | Freq: Three times a day (TID) | INTRAMUSCULAR | Status: DC
  Administered 2021-11-10: 1 [IU] via SUBCUTANEOUS
  Filled 2021-11-09: qty 0.06

## 2021-11-09 MED ORDER — LORAZEPAM 1 MG PO TABS: 1.0000 mg | ORAL_TABLET | Freq: Once | ORAL | Status: AC

## 2021-11-09 MED ORDER — DONEPEZIL HCL 5 MG PO TABS
5.0000 mg | ORAL_TABLET | Freq: Every day | ORAL | Status: DC
  Administered 2021-11-10 (×2): 5 mg via ORAL
  Filled 2021-11-09 (×2): qty 1

## 2021-11-09 MED ORDER — ONDANSETRON HCL 4 MG/2ML IJ SOLN
4.0000 mg | Freq: Four times a day (QID) | INTRAMUSCULAR | Status: DC | PRN
Start: 2021-11-09 — End: 2021-11-11

## 2021-11-09 MED ORDER — LORAZEPAM 2 MG/ML IJ SOLN
1.0000 mg | Freq: Once | INTRAMUSCULAR | Status: AC
  Administered 2021-11-10: 1 mg via INTRAMUSCULAR
  Filled 2021-11-09: qty 1

## 2021-11-09 MED ORDER — FAMOTIDINE IN NACL 20-0.9 MG/50ML-% IV SOLN: 20.0000 mg | Freq: Two times a day (BID) | INTRAVENOUS | Status: DC

## 2021-11-09 NOTE — ED Notes (Signed)
Pt became excessively violent hitting, kicking and biting at staff while we attempted to change her brief as well as the linen on the bed.  Pt cursing at staff.  Family at bedside tearful.  Dr. Ronaldo Miyamoto notified to obtain PRN order for agitation.  Also have no IV access at this time d/t agitation. Pt still refusing monitoring equipment.  ?

## 2021-11-09 NOTE — ED Provider Notes (Signed)
I provided a substantive portion of the care of this patient.  I personally performed the entirety of the medical decision making for this encounter. ? ? ?81 year old female presents with lip edema.  Patient is on ACE inhibitor.  Findings consistent with angioedema.  She with epi as well as medications and will require admission.  Her airway is intact.  Her voice is normal. ?   ?  ?Lorre Nick, MD ?11/09/21 1255 ? ?

## 2021-11-09 NOTE — ED Notes (Signed)
Per Olegario Messier in the lab they will add on A1c and serum creatinine  ?

## 2021-11-09 NOTE — ED Triage Notes (Addendum)
Pt presents to the ED from home for angioedema with severe lip swelling. Per EMS, family found the pt to have swelling in her lip upon waking this morning with rash to the left leg. Per EMS, PCP recommended 50mg  PO Benadryl given at 0800 this morning. Per EMS, pt's lip swelling has increased significantly with no airway compromise at this time. Pt with a hx of dementia. Per EMS, no known allergies, no new foods, no new meds, and no new household contacts. Unknown if pt takes an ACE inhibitor.  ?

## 2021-11-09 NOTE — ED Notes (Signed)
Pt has removed all clothing and monitoring equipment along with pulling IV out.  Dr. Ronaldo Miyamoto contacted for order for home seroquel as family states that is what calms her at home. Seroquel given, pt and family notified that another IV line would need to be established. Family at bedside not agreeable with IV establishment until pt calms down.  ?

## 2021-11-09 NOTE — H&P (Signed)
?History and Physical  ? ? ?Patient: Stephanie Gould [id number] DOB: [birth year] ?DOA: 11/09/2021 ?DOS: the patient was seen and examined on 11/09/2021 ?PCP: Rogers Blocker, MD  ?Patient coming from: Home ? ?Chief Complaint:  ?Chief Complaint  ?Patient presents with  ? Angioedema  ? ? ?HPI: Stephanie Gould is a 81 y.o. female with medical history significant of dementia, HTN, DM2, HLD. Presenting with lip swelling. She was in her normal state of health until about 8am this morning. Her family noted that she has swelling in her bottom lip. They gave her 50 mg of benadryl and her morning medications. She was able to eat breakfast w/o incident, but they noted that her lip continued to swell. It was apparent that her upper lip was swelling as well. She didn't have any tongue swelling at the time. She didn't complain of difficulty swallowing or breathing. There was no excessive drooling. The family had never seen anything like this before, so they brought her to the ED for evaluation. They denies any other aggravating or alleviating factors.   ? ?Review of Systems: As mentioned in the history of present illness. All other systems reviewed and are negative. ?Past Medical History:  ?Diagnosis Date  ? Acute encephalopathy   ? Allergic rhinosinusitis   ? Diabetes mellitus   ? Elevated troponin   ? Hypertension   ? Hypokalemia   ? Obesity   ? ?Past Surgical History:  ?Procedure Laterality Date  ? FOOT SURGERY    ? ?Social History:  reports that she has never smoked. She has never used smokeless tobacco. She reports that she does not drink alcohol and does not use drugs. ? ?No Known Allergies ? ?Family History  ?Problem Relation Age of Onset  ? Dementia Mother   ? Diverticulitis Sister   ? Dementia Brother   ? ? ?Prior to Admission medications   ?Medication Sig Start Date End Date Taking? Authorizing Provider  ?amLODipine-benazepril (LOTREL) 5-20 MG capsule Take 1 capsule by mouth daily with lunch.   Yes [provider]   ?donepezil (ARICEPT) 10 MG tablet Take 0.5 tablets (5 mg total) by mouth at bedtime. Must be seen at follow up on 03/19/21 for further refills 05/16/21  Yes Lomax, Amy, NP  ?glipiZIDE (GLUCOTROL XL) 10 MG 24 hr tablet Take 10 mg by mouth daily with breakfast.   Yes [provider]  ?QUEtiapine (SEROQUEL) 25 MG tablet Take 25 mg by mouth at bedtime.   Yes [provider]  ?rosuvastatin (CRESTOR) 5 MG tablet Take 5 mg by mouth daily.   Yes [provider]  ?chlorthalidone (HYGROTON) 25 MG tablet Take 25 mg by mouth See admin instructions. Take 25mg  on Sundays and Wednesdays only. ?Patient not taking: Reported on 11/09/2021 06/19/21   [provider]  ? ? ?Physical Exam: ?Vitals:  ? 11/09/21 1315 11/09/21 1345 11/09/21 1415 11/09/21 1430  ?BP: (!) 147/73 139/62 137/75 137/64  ?Pulse: (!) 49 (!) 49 (!) 56 (!) 50  ?Resp: (!) 21 12 10 12   ?Temp:      ?TempSrc:      ?SpO2: 100% 100% 100% 100%  ? ?General: 81 y.o. female resting in bed in NAD ?Eyes: PERRL, normal sclera ?ENMT: Nares patent w/o discharge, orophaynx clear, dentition normal, ears w/o discharge/lesions/ulcers; significant swelling of both lips, tongue is without swelling, airway is clear ?Neck: Supple, trachea midline ?Cardiovascular: RRR, +S1, S2, no m/g/r, equal pulses throughout ?Respiratory: CTABL, no w/r/r, normal WOB ?GI: BS+, NDNT, no masses noted,  no organomegaly noted ?MSK: No e/c/c ?Neuro: A&O x 1, no focal deficits ?Psyc: pleasantly demented, calm/cooperative ? ?Data Reviewed: ? ?BUN 31 ?Scr 0.92 ?Hgb 11.9 ?WBC: 4.6 ? ?EKG: sinus bradycardia, no st elevations ? ?Assessment and Plan: ?No notes have been filed under this hospital service. ?Service: Hospitalist ?Angioedema secondary to ACEi usage ?    - admit to inpt, progressive ?    - hold ACEi and add to allergy list ?    - received steroids, benadryl, pepcid in ED; presumption is that this is ACEi mediate, but not entirely sure; will continue steroids/H1/H2 blockade  ON ?    - NPO except sips/ice chips ? ?Dementia ?    - resume home regimen ? ?HTN ?    - can resume norvasc ?    - hold ACEi and add to allergy list ? ?DM2 ?    - NPO for right now ?    - check A1c, add SSI, glucose checks ? ?HLD ?    - continue home regimen ? ?Advance Care Planning:   Code Status: FULL ? ?Consults: None ? ?Family Communication: w/ dtr/granddtr at bedside ? ?Severity of Illness: ?The appropriate patient status for this patient is INPATIENT. Inpatient status is judged to be reasonable and necessary in order to provide the required intensity of service to ensure the patient's safety. The patient's presenting symptoms, physical exam findings, and initial radiographic and laboratory data in the context of their chronic comorbidities is felt to place them at high risk for further clinical deterioration. Furthermore, it is not anticipated that the patient will be medically stable for discharge from the hospital within 2 midnights of admission.  ? ?* I certify that at the point of admission it is my clinical judgment that the patient will require inpatient hospital care spanning beyond 2 midnights from the point of admission due to high intensity of service, high risk for further deterioration and high frequency of surveillance required.* ? ?Author: ?Jonnie Finner, DO ?11/09/2021 2:55 PM ? ?For on call review [email or web address].  ?

## 2021-11-09 NOTE — ED Provider Notes (Signed)
[facility] DEPT Provider Note   CSN: [id number] Arrival date & time: 11/09/21  1208     History  Chief Complaint  Patient presents with   Angioedema    Stephanie Gould is a 81 y.o. female.  81 year old female brought in by EMS from home with concern for facial swelling which family first noticed around 8:00 this morning involving the lower lip.  Patient was given Benadryl around that time, no improvement in the swelling, has progressed to involve the upper lip now.  Patient was not given any medications by EMS. Patient denies difficulty breathing or swallowing.  History of dementia, diabetes, hypertension.  Is on Amlodipine/Benazepril, last dose was around 12 yesterday afternoon.  No new medications or changes in medications. No recent illness. Family did note rash to the right thigh that has since resolved. Family also adds patient is trained in kick boxing and can get aggressive, is physically very strong.       Home Medications Prior to Admission medications   Medication Sig Start Date End Date Taking? Authorizing Provider  amLODipine-benazepril (LOTREL) 5-20 MG capsule Take 1 capsule by mouth daily with lunch.   Yes [provider]  donepezil (ARICEPT) 10 MG tablet Take 0.5 tablets (5 mg total) by mouth at bedtime. Must be seen at follow up on 03/19/21 for further refills 05/16/21  Yes Lomax, Amy, NP  glipiZIDE (GLUCOTROL XL) 10 MG 24 hr tablet Take 10 mg by mouth daily with breakfast.   Yes [provider]  QUEtiapine (SEROQUEL) 25 MG tablet Take 25 mg by mouth at bedtime.   Yes [provider]  rosuvastatin (CRESTOR) 5 MG tablet Take 5 mg by mouth daily.   Yes [provider]  chlorthalidone (HYGROTON) 25 MG tablet Take 25 mg by mouth See admin instructions. Take 25mg  on Sundays and Wednesdays only. Patient not taking: Reported on 11/09/2021 06/19/21   [provider]      Allergies    Patient has no known  allergies.    Review of Systems   Review of Systems Dementia, unable to obtain  Physical Exam Updated Vital Signs BP 137/64    Pulse (!) 50    Temp (!) 97.2 F (36.2 C) (Oral)    Resp 12    SpO2 100%  Physical Exam Vitals and nursing note reviewed.  Constitutional:      General: She is not in acute distress.    Appearance: She is well-developed. She is not diaphoretic.  HENT:     Head: Atraumatic.     Comments: Angioedema up upper and lower lips, as pictured. Submandibular space is soft    Nose: Nose normal.     Mouth/Throat:     Mouth: Mucous membranes are moist.     Pharynx: No uvula swelling.  Eyes:     Conjunctiva/sclera: Conjunctivae normal.  Cardiovascular:     Rate and Rhythm: Normal rate and regular rhythm.     Heart sounds: Normal heart sounds.  Pulmonary:     Effort: Pulmonary effort is normal.     Breath sounds: Normal breath sounds. No wheezing.  Abdominal:     Palpations: Abdomen is soft.     Tenderness: There is no abdominal tenderness.  Musculoskeletal:     Right lower leg: No edema.     Left lower leg: No edema.  Skin:    General: Skin is warm and dry.     Findings: No erythema or rash.  Neurological:  Mental Status: She is alert. Mental status is at baseline.  Psychiatric:        Behavior: Behavior normal.       ED Results / Procedures / Treatments   Labs (all labs ordered are listed, but only abnormal results are displayed) Labs Reviewed  COMPREHENSIVE METABOLIC PANEL - Abnormal; Notable for the following components:      Result Value   Glucose, Bld 102 (*)    BUN 31 (*)    All other components within normal limits  CBC WITH DIFFERENTIAL/PLATELET - Abnormal; Notable for the following components:   RBC 3.73 (*)    Hemoglobin 11.9 (*)    All other components within normal limits  RESP PANEL BY RT-PCR (FLU A&B, COVID) ARPGX2    EKG EKG Interpretation  Date/Time:  Friday November 09 2021 13:01:30 EST Ventricular Rate:  53 PR  Interval:  215 QRS Duration: 82 QT Interval:  463 QTC Calculation: 435 R Axis:   33 Text Interpretation: Sinus rhythm Borderline prolonged PR interval Minimal ST elevation, anterior leads Confirmed by Lacretia Leigh ([id number]) on 11/09/2021 2:22:01 PM  Radiology No results found.  Procedures .Critical Care Performed by: Tacy Learn, PA-C Authorized by: Tacy Learn, PA-C   Critical care provider statement:    Critical care time (minutes):  30   Critical care was time spent personally by me on the following activities:  Development of treatment plan with patient or surrogate, discussions with consultants, evaluation of patient's response to treatment, examination of patient, ordering and review of laboratory studies, ordering and review of radiographic studies, ordering and performing treatments and interventions, pulse oximetry, re-evaluation of patient's condition and review of old charts    Medications Ordered in ED Medications  diphenhydrAMINE (BENADRYL) 50 mg in sodium chloride 0.9 % 50 mL IVPB (has no administration in time range)  EPINEPHrine (EPI-PEN) injection 0.3 mg (0.3 mg Intramuscular Given 11/09/21 1229)  methylPREDNISolone sodium succinate (SOLU-MEDROL) 125 mg/2 mL injection 125 mg (125 mg Intravenous Given 11/09/21 1232)  famotidine (PEPCID) IVPB 20 mg premix (0 mg Intravenous Stopped 11/09/21 1328)    ED Course/ Medical Decision Making/ A&P                           Medical Decision Making Amount and/or Complexity of Data Reviewed Labs: ordered.  Risk Prescription drug management. Decision regarding hospitalization.   This patient presents to the ED for concern of facial swelling, this involves an extensive number of treatment options, and is a complaint that carries with it a high risk of complications and morbidity.  The differential diagnosis includes but not limited to anaphylaxis, idiopathic angioedema, ACE inhibitor angioedema   Co morbidities that  complicate the patient evaluation  Dementia, hypertension, nuclear media   Additional history obtained:  Additional history obtained from daughter and granddaughter at bedside who provide whole history External records from outside source obtained and reviewed including medication list verifying patient is on an ACE inhibitor   Lab Tests:  I Ordered, and personally interpreted labs.  The pertinent results include: CBC, CMP, no significant findings   Cardiac Monitoring:  The patient was maintained on a cardiac monitor.  I personally viewed and interpreted the cardiac monitored which showed an underlying rhythm of: Sinus rhythm   Medicines ordered and prescription drug management:  I ordered medication including Pepcid, epi, Solu-Medrol for angioedema Reevaluation of the patient after these medicines showed that the patient stayed the same I  have reviewed the patients home medicines and have made adjustments as needed   Critical Interventions:  Epi, Pepcid, Solu-Medrol given for facial swelling/angioedema   Consultations Obtained:  I requested consultation with the hospitalist,  and discussed lab and imaging findings as well as pertinent plan - they recommend: Will consult for admission   Problem List / ED Course:  81 year old female brought in by EMS from home with concern for angioedema, is on an ACE inhibitor.  Lower lip swelling initially, no improvement with Benadryl at home.  Patient arrives with upper and lower lip swelling but is maintaining airway and patient denies complaints. She was given epi, Solu-Medrol, Pepcid, had already had Benadryl at home.  Patient was monitored in the emergency room, symptoms did not improve or worsen.  Patient was seen by Dr. Zenia Resides, ER attending on arrival as well. Plan is for admission for monitoring.  Discussed plan of care with patient and family.          Final Clinical Impression(s) / ED Diagnoses Final diagnoses:   Angioedema, initial encounter  Angiotensin converting enzyme inhibitor-aggravated angioedema, initial encounter    Rx / DC Orders ED Discharge Orders     None         Tacy Learn, PA-C 11/09/21 1456    Lacretia Leigh, MD 11/10/21 1234

## 2021-11-10 ENCOUNTER — Encounter (HOSPITAL_COMMUNITY): Payer: Self-pay | Admitting: Internal Medicine

## 2021-11-10 ENCOUNTER — Other Ambulatory Visit: Payer: Self-pay

## 2021-11-10 DIAGNOSIS — F0280 Dementia in other diseases classified elsewhere without behavioral disturbance: Secondary | ICD-10-CM

## 2021-11-10 DIAGNOSIS — T464X5A Adverse effect of angiotensin-converting-enzyme inhibitors, initial encounter: Secondary | ICD-10-CM

## 2021-11-10 DIAGNOSIS — E119 Type 2 diabetes mellitus without complications: Secondary | ICD-10-CM

## 2021-11-10 DIAGNOSIS — I10 Essential (primary) hypertension: Secondary | ICD-10-CM

## 2021-11-10 DIAGNOSIS — E785 Hyperlipidemia, unspecified: Secondary | ICD-10-CM

## 2021-11-10 DIAGNOSIS — G309 Alzheimer's disease, unspecified: Secondary | ICD-10-CM

## 2021-11-10 LAB — GLUCOSE, CAPILLARY
Glucose-Capillary: 138 mg/dL — ABNORMAL HIGH (ref 70–99)
Glucose-Capillary: 120 mg/dL — ABNORMAL HIGH (ref 70–99)

## 2021-11-10 LAB — COMPREHENSIVE METABOLIC PANEL
ALT: 14 U/L (ref 0–44)
AST: 19 U/L (ref 15–41)
Albumin: 3.5 g/dL (ref 3.5–5.0)
Alkaline Phosphatase: 60 U/L (ref 38–126)
Anion gap: 9 (ref 5–15)
BUN: 27 mg/dL — ABNORMAL HIGH (ref 8–23)
CO2: 24 mmol/L (ref 22–32)
Calcium: 8.9 mg/dL (ref 8.9–10.3)
Chloride: 105 mmol/L (ref 98–111)
Creatinine, Ser: 0.76 mg/dL (ref 0.44–1.00)
GFR, Estimated: >60 mL/min (ref >60)
Glucose, Bld: 145 mg/dL — ABNORMAL HIGH (ref 70–99)
Potassium: 4.4 mmol/L (ref 3.5–5.1)
Sodium: 138 mmol/L (ref 135–145)
Total Bilirubin: 0.5 mg/dL (ref 0.3–1.2)
Total Protein: 6.6 g/dL (ref 6.5–8.1)

## 2021-11-10 LAB — CBC
HCT: 34.3 % — ABNORMAL LOW (ref 36.0–46.0)
Hemoglobin: 11.2 g/dL — ABNORMAL LOW (ref 12.0–15.0)
MCH: 31.6 pg (ref 26.0–34.0)
MCHC: 32.7 g/dL (ref 30.0–36.0)
MCV: 96.9 fL (ref 80.0–100.0)
Platelets: 168 10*3/uL (ref 150–400)
RBC: 3.54 MIL/uL — ABNORMAL LOW (ref 3.87–5.11)
RDW: 14.5 % (ref 11.5–15.5)
WBC: 3.7 10*3/uL — ABNORMAL LOW (ref 4.0–10.5)
nRBC: 0 % (ref 0.0–0.2)

## 2021-11-10 MED ORDER — ENOXAPARIN SODIUM 40 MG/0.4ML IJ SOSY
40.0000 mg | PREFILLED_SYRINGE | INTRAMUSCULAR | Status: DC
  Administered 2021-11-10: 40 mg via SUBCUTANEOUS
  Filled 2021-11-10: qty 0.4

## 2021-11-10 MED ORDER — DIPHENHYDRAMINE HCL 25 MG PO CAPS
25.0000 mg | ORAL_CAPSULE | Freq: Three times a day (TID) | ORAL | Status: DC
  Administered 2021-11-10: 25 mg via ORAL
  Filled 2021-11-10: qty 1

## 2021-11-10 MED ORDER — SODIUM CHLORIDE 0.9 % IV SOLN: INTRAVENOUS | Status: DC

## 2021-11-10 MED ORDER — METHYLPREDNISOLONE SODIUM SUCC 40 MG IJ SOLR
40.0000 mg | Freq: Once | INTRAMUSCULAR | Status: AC
  Administered 2021-11-10: 40 mg via INTRAVENOUS
  Filled 2021-11-10: qty 1

## 2021-11-10 MED ORDER — FAMOTIDINE 20 MG PO TABS
20.0000 mg | ORAL_TABLET | Freq: Once | ORAL | Status: AC
  Administered 2021-11-10: 20 mg via ORAL
  Filled 2021-11-10: qty 1

## 2021-11-10 MED ORDER — DIPHENHYDRAMINE HCL 50 MG/ML IJ SOLN
25.0000 mg | Freq: Three times a day (TID) | INTRAMUSCULAR | Status: AC
  Administered 2021-11-10: 25 mg via INTRAMUSCULAR
  Filled 2021-11-10: qty 1

## 2021-11-10 MED ORDER — PREDNISONE 20 MG PO TABS
40.0000 mg | ORAL_TABLET | Freq: Every day | ORAL | Status: DC
  Administered 2021-11-11: 40 mg via ORAL
  Filled 2021-11-10: qty 2

## 2021-11-10 MED ORDER — FAMOTIDINE 20 MG PO TABS
20.0000 mg | ORAL_TABLET | Freq: Two times a day (BID) | ORAL | Status: DC
  Administered 2021-11-10 – 2021-11-11 (×3): 20 mg via ORAL
  Filled 2021-11-10 (×3): qty 1

## 2021-11-10 NOTE — Progress Notes (Signed)
Database questions answered by daughter at bedside-Stephanie Gould, with POA papers on hand.  ?

## 2021-11-10 NOTE — Plan of Care (Signed)
?  Problem: Clinical Measurements: ?Goal: Ability to maintain clinical measurements within normal limits will improve ?11/10/2021 1757 by Nadean Corwin, RN ?Outcome: Progressing ?11/10/2021 1756 by Nadean Corwin, RN ?Outcome: Progressing ?Goal: Will remain free from infection ?11/10/2021 1757 by Nadean Corwin, RN ?Outcome: Progressing ?11/10/2021 1756 by Nadean Corwin, RN ?Outcome: Progressing ?Goal: Diagnostic test results will improve ?11/10/2021 1757 by Nadean Corwin, RN ?Outcome: Progressing ?11/10/2021 1756 by Nadean Corwin, RN ?Outcome: Progressing ?Goal: Respiratory complications will improve ?11/10/2021 1757 by Nadean Corwin, RN ?Outcome: Progressing ?11/10/2021 1756 by Nadean Corwin, RN ?Outcome: Progressing ?Goal: Cardiovascular complication will be avoided ?11/10/2021 1757 by Nadean Corwin, RN ?Outcome: Progressing ?11/10/2021 1756 by Nadean Corwin, RN ?Outcome: Progressing ?  ?Problem: Activity: ?Goal: Risk for activity intolerance will decrease ?11/10/2021 1757 by Nadean Corwin, RN ?Outcome: Progressing ?11/10/2021 1756 by Nadean Corwin, RN ?Outcome: Progressing ?  ?Problem: Nutrition: ?Goal: Adequate nutrition will be maintained ?11/10/2021 1757 by Nadean Corwin, RN ?Outcome: Progressing ?11/10/2021 1756 by Nadean Corwin, RN ?Outcome: Progressing ?  ?Problem: Coping: ?Goal: Level of anxiety will decrease ?11/10/2021 1757 by Nadean Corwin, RN ?Outcome: Progressing ?11/10/2021 1756 by Nadean Corwin, RN ?Outcome: Progressing ?  ?Problem: Elimination: ?Goal: Will not experience complications related to bowel motility ?11/10/2021 1757 by Nadean Corwin, RN ?Outcome: Progressing ?11/10/2021 1756 by Nadean Corwin, RN ?Outcome: Progressing ?Goal: Will not experience complications related to urinary retention ?11/10/2021 1757 by Nadean Corwin, RN ?Outcome: Progressing ?11/10/2021 1756 by Nadean Corwin, RN ?Outcome: Progressing ?  ?Problem: Pain Managment: ?Goal: General experience of comfort  will improve ?11/10/2021 1757 by Nadean Corwin, RN ?Outcome: Progressing ?11/10/2021 1756 by Nadean Corwin, RN ?Outcome: Progressing ?  ?Problem: Safety: ?Goal: Ability to remain free from injury will improve ?11/10/2021 1757 by Nadean Corwin, RN ?Outcome: Progressing ?11/10/2021 1756 by Nadean Corwin, RN ?Outcome: Progressing ?  ?Problem: Skin Integrity: ?Goal: Risk for impaired skin integrity will decrease ?11/10/2021 1757 by Nadean Corwin, RN ?Outcome: Progressing ?11/10/2021 1756 by Nadean Corwin, RN ?Outcome: Progressing ?  ?

## 2021-11-10 NOTE — Assessment & Plan Note (Signed)
continue statin

## 2021-11-10 NOTE — Progress Notes (Signed)
End of shift ? ?Pt slept most of the day.  Dr aware, perhaps the benedryl which has been d/c'd.   ? 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  Per daughter pt also sundowns.   ? ?Pt has purwick in place and is incontinent of bladder and bowel; IV fluids have been started.  Per daughter, pt can swallow pills, however, she appeared to chew them when given.   ? ?Pt's daughter will spend the night to assist with keeping the pt calm as she has alzheimers.   ?

## 2021-11-10 NOTE — Assessment & Plan Note (Signed)
--   Continue Aricept, Seroquel.  Sundown syndrome.  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 ?

## 2021-11-10 NOTE — Assessment & Plan Note (Signed)
--  stable, continue SSI 

## 2021-11-10 NOTE — Assessment & Plan Note (Signed)
--   Presumed secondary to ACE inhibitor.  Still with significant edema.  Continue to monitor for signs.  Continue steroid, Benadryl, Pepcid.  Can advance diet. ?

## 2021-11-10 NOTE — ED Notes (Signed)
Patient is refusing IV. Patient was hitting and kicking staff while staff was trying to clean her up. It took over 30 min to give her medication. ?

## 2021-11-10 NOTE — Hospital Course (Signed)
81 year old woman PMH including hypertension on ACE inhibitor, presenting with profound lip swelling, admitted for presumed angioedema ?--3/4 lips remain swollen.  Does not comply with exam but respiratory status appears stable. Will continue current Rx until swelling decreases ?

## 2021-11-10 NOTE — Assessment & Plan Note (Signed)
--  stable, permanently stop benazepril ?--can resume amlodipine on discharge ?

## 2021-11-10 NOTE — Progress Notes (Signed)
?  Progress Note ? ? ?Patient: Stephanie Gould ZES:[id number] DOB: 01/29/1941 DOA: 11/09/2021     1 ?DOS: the patient was seen and examined on 11/10/2021 ?  ?Brief hospital course: ?81 year old woman PMH including hypertension on ACE inhibitor, presenting with profound lip swelling, admitted for presumed angioedema ?--3/4 lips remain swollen.  Does not comply with exam but respiratory status appears stable. Will continue current Rx until swelling decreases ? ?Assessment and Plan: ?* Angioedema ?-- Presumed secondary to ACE inhibitor.  Still with significant edema.  Continue to monitor for signs.  Continue steroid, Benadryl, Pepcid.  Can advance diet. ? ?Alzheimer's disease (HCC) ?-- Continue Aricept, Seroquel.  Sundown syndrome.  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 ? ?Diabetes mellitus (HCC) ?--stable, continue SSI ? ?HLD (hyperlipidemia) ?--continue statin ? ?Benign essential HTN ?--stable, permanently stop benazepril ?--can resume amlodipine on discharge ? ? ? ? ?Subjective:  ?Sleeping per daughter ?No previous swelling. Began all of sudden yesterday ?Pt is a Charity fundraiser and former Facilities manager. Dementia is severe but physical strength is impressive. ? ?Physical Exam: ?Vitals:  ? 11/10/21 0405 11/10/21 0738 11/10/21 0802 11/10/21 1119  ?BP: (!) 104/50 (!) 161/45 (!) 157/66 121/75  ?Pulse:  (!) 46 (!) 54 (!) 57  ?Resp: (!) 110 14  14  ?Temp:  (!) 94.6 ?F (34.8 ?C) (!) 96.2 ?F (35.7 ?C) (!) 97.4 ?F (36.3 ?C)  ?TempSrc:  Axillary Axillary Oral  ?SpO2: 100% 100%  100%  ?Weight: 62.4 kg     ?Height: 5\' 5"  (1.651 m)     ? ?Physical Exam ?Constitutional:   ?   General: She is not in acute distress. ?   Appearance: She is not ill-appearing or toxic-appearing.  ?   Comments: Sleeping  ?HENT:  ?   Mouth/Throat:  ?   Comments: Lips with massive edema ?Cardiovascular:  ?   Rate and Rhythm: Normal rate and regular rhythm.  ?   Heart sounds: No murmur  heard. ?Pulmonary:  ?   Effort: Pulmonary effort is normal. No respiratory distress.  ?   Breath sounds: No wheezing, rhonchi or rales.  ? ? ? ?Data Reviewed: ? ?CMP unremarkable ?Hgb stable 11.2 ? ?Family Communication: daughter at bedside ? ?Disposition: ?Status is: Inpatient ?Remains inpatient appropriate because: angioedema ? ? ? ? ? ? ? ? ? Planned Discharge Destination: Home w/ daughter ? ? ? ? ?Time spent: 25 minutes ? ?Author: ? , MD ?11/10/2021 2:07 PM ? ?For on call review [email or web address].  ? ?

## 2021-11-11 LAB — GLUCOSE, CAPILLARY: Glucose-Capillary: 147 mg/dL — ABNORMAL HIGH (ref 70–99)

## 2021-11-11 MED ORDER — AMLODIPINE BESYLATE 5 MG PO TABS: 5.0000 mg | ORAL_TABLET | Freq: Every day | ORAL | 1 refills | Status: AC

## 2021-11-11 MED ORDER — FAMOTIDINE 20 MG PO TABS: 20.0000 mg | ORAL_TABLET | Freq: Two times a day (BID) | ORAL | 0 refills | Status: AC

## 2021-11-11 MED ORDER — PREDNISONE 20 MG PO TABS: 20.0000 mg | ORAL_TABLET | Freq: Every day | ORAL | 0 refills | Status: AC

## 2021-11-11 NOTE — Plan of Care (Signed)

## 2021-11-11 NOTE — Discharge Summary (Signed)
?Physician Discharge Summary ?  ?Patient: Stephanie Gould MRN: [id number] DOB: 1941-01-28  ?Admit date:     11/09/2021  ?Discharge date: 11/11/21  ?Discharge Physician: Brendia Sacks  ? ?PCP: [name], MD  ? ?Recommendations at discharge:  ? ?Resolution of angioedema ?ACE-I permanently discontinued and added to allergy list.  ? ?Discharge Diagnoses: ?Principal Problem: ?  Angioedema ?Active Problems: ?  Alzheimer's disease (HCC) ?  Diabetes mellitus (HCC) ?  Benign essential HTN ?  HLD (hyperlipidemia) ? ?Hospital Course: ?81 year old woman PMH including hypertension on ACE inhibitor, presenting with profound lip swelling, admitted for angioedema, treated with standard therapy with gradual clinical improvement.  On the day of discharge near resolution of swelling. ACE-I stopped and rationale discussed with daughter. ? ?* Angioedema ?-- appears nearly resolved. Presumed secondary to ACE inhibitor. Continue brief course of steroid, Pepcid.    ? ?Alzheimer's disease (HCC) ?-- stable, continue Aricept, Seroquel.  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 ? ?Diabetes mellitus (HCC) ?--stable, continue SSI ? ?HLD (hyperlipidemia) ?--continue statin ? ?Benign essential HTN ?--stable, permanently stop benazepril ?--can resume amlodipine on discharge ? ? ? ? ?  ? ? ? ? ?Consultants: none ?Procedures performed: none  ?Disposition: Home ?Diet recommendation:  ?Discharge Diet Orders (From admission, onward)  ? ?  Start     Ordered  ? 11/11/21 0000  Diet general       ? 11/11/21 0941  ? ?  ?  ? ?  ? ?Regular diet ? ?DISCHARGE MEDICATION: ?Allergies as of 11/11/2021   ? ?   Reactions  ? Ace Inhibitors Swelling  ? ?  ? ?  ?Medication List  ?  ? ?STOP taking these medications   ? ?amLODipine-benazepril 5-20 MG capsule ?Commonly known as: LOTREL ?  ?chlorthalidone 25 MG tablet ?Commonly known as: HYGROTON ?  ? ?  ? ?TAKE these medications   ? ?amLODipine 5 MG  tablet ?Commonly known as: NORVASC ?Take 1 tablet (5 mg total) by mouth daily. ?  ?donepezil 10 MG tablet ?Commonly known as: ARICEPT ?Take 0.5 tablets (5 mg total) by mouth at bedtime. Must be seen at follow up on 03/19/21 for further refills ?  ?famotidine 20 MG tablet ?Commonly known as: PEPCID ?Take 1 tablet (20 mg total) by mouth 2 (two) times daily for 3 days. ?  ?glipiZIDE 10 MG 24 hr tablet ?Commonly known as: GLUCOTROL XL ?Take 10 mg by mouth daily with breakfast. ?  ?predniSONE 20 MG tablet ?Commonly known as: DELTASONE ?Take 1 tablet (20 mg total) by mouth daily with breakfast for 3 days. ?Start taking on: November 12, 2021 ?  ?QUEtiapine 25 MG tablet ?Commonly known as: SEROQUEL ?Take 25 mg by mouth at bedtime. ?  ?rosuvastatin 5 MG tablet ?Commonly known as: CRESTOR ?Take 5 mg by mouth daily. ?  ? ?  ? ? Follow-up Information   ? ? [name], MD Follow up.   ?Specialty: Internal Medicine ?Why: If symptoms worsen ?Contact information: ?[address] ?[address] ?[place] [address] ?[phone] ? ? ?  ?  ? ?  ?  ? ?  ? ?Feels good ?Daughter pleased with improvement ? ?Discharge Exam: ?Filed Weights  ? 11/10/21 0002 11/10/21 0243 11/10/21 0405  ?Weight: 62.4 kg 62.4 kg 62.4 kg  ? ?Physical Exam ?Vitals reviewed.  ?Constitutional:   ?   General: She is not in acute distress. ?   Appearance: She is  not ill-appearing or toxic-appearing.  ?HENT:  ?   Mouth/Throat:  ?   Comments: Lip swelling nearly resolved, tongue appears unremarkable on limited exam. Will not open move fully. ?Cardiovascular:  ?   Rate and Rhythm: Normal rate and regular rhythm.  ?   Heart sounds: No murmur heard. ?Neurological:  ?   Mental Status: She is alert.  ?Psychiatric:     ?   Mood and Affect: Mood normal.     ?   Behavior: Behavior normal.  ? ? ? ?Condition at discharge: good ? ?The results of significant diagnostics from this hospitalization (including imaging, microbiology, ancillary and laboratory) are listed below for  reference.  ? ?Imaging Studies: ?No results found. ? ?Microbiology: ?Results for orders placed or performed during the hospital encounter of 11/09/21  ?Resp Panel by RT-PCR (Flu A&B, Covid) Nasopharyngeal Swab     Status: None  ? Collection Time: 11/09/21 12:49 PM  ? Specimen: Nasopharyngeal Swab; Nasopharyngeal(NP) swabs in vial transport medium  ?Result Value Ref Range Status  ? SARS Coronavirus 2 by RT PCR NEGATIVE NEGATIVE Final  ?  Comment: (NOTE) ?SARS-CoV-2 target nucleic acids are NOT DETECTED. ? ?The SARS-CoV-2 RNA is generally detectable in upper respiratory ?specimens during the acute phase of infection. The lowest ?concentration of SARS-CoV-2 viral copies this assay can detect is ?138 copies/mL. A negative result does not preclude SARS-Cov-2 ?infection and should not be used as the sole basis for treatment or ?other patient management decisions. A negative result may occur with  ?improper specimen collection/handling, submission of specimen other ?than nasopharyngeal swab, presence of viral mutation(s) within the ?areas targeted by this assay, and inadequate number of viral ?copies(<138 copies/mL). A negative result must be combined with ?clinical observations, patient history, and epidemiological ?information. The expected result is Negative. ? ?Fact Sheet for Patients:  ?BloggerCourse.com ? ?Fact Sheet for Healthcare Providers:  ?SeriousBroker.it ? ?This test is no t yet approved or cleared by the [place] FDA and  ?has been authorized for detection and/or diagnosis of SARS-CoV-2 by ?FDA under an Emergency Use Authorization (EUA). This EUA will remain  ?in effect (meaning this test can be used) for the duration of the ?COVID-19 declaration under Section 564(b)(1) of the Act, 21 ?U.S.C.section 360bbb-3(b)(1), unless the authorization is terminated  ?or revoked sooner.  ? ? ?  ? Influenza A by PCR NEGATIVE NEGATIVE Final  ? Influenza B by PCR NEGATIVE  NEGATIVE Final  ?  Comment: (NOTE) ?The Xpert Xpress SARS-CoV-2/FLU/RSV plus assay is intended as an aid ?in the diagnosis of influenza from Nasopharyngeal swab specimens and ?should not be used as a sole basis for treatment. Nasal washings and ?aspirates are unacceptable for Xpert Xpress SARS-CoV-2/FLU/RSV ?testing. ? ?Fact Sheet for Patients: ?BloggerCourse.com ? ?Fact Sheet for Healthcare Providers: ?SeriousBroker.it ? ?This test is not yet approved or cleared by the [place] FDA and ?has been authorized for detection and/or diagnosis of SARS-CoV-2 by ?FDA under an Emergency Use Authorization (EUA). This EUA will remain ?in effect (meaning this test can be used) for the duration of the ?COVID-19 declaration under Section 564(b)(1) of the Act, 21 U.S.C. ?section 360bbb-3(b)(1), unless the authorization is terminated or ?revoked. ? ?Performed at [facility], [address] ?[place], [place] [address] ?  ? ? ?Labs: ?CBC: ?Recent Labs  ?Lab 11/09/21 ?1249 11/10/21 ?1771  ?WBC 4.6 3.7*  ?NEUTROABS 2.8  --   ?HGB 11.9* 11.2*  ?HCT 36.7 34.3*  ?MCV 98.4 96.9  ?PLT 165  168  ? ?Basic Metabolic Panel: ?Recent Labs  ?Lab 11/09/21 ?1249 11/10/21 ?0539  ?NA 138 138  ?K 4.5 4.4  ?CL 105 105  ?CO2 24 24  ?GLUCOSE 102* 145*  ?BUN 31* 27*  ?CREATININE 0.92 0.76  ?CALCIUM 9.0 8.9  ? ?Liver Function Tests: ?Recent Labs  ?Lab 11/09/21 ?1249 11/10/21 ?7673  ?AST 25 19  ?ALT 16 14  ?ALKPHOS 61 60  ?BILITOT 0.7 0.5  ?PROT 7.4 6.6  ?ALBUMIN 4.0 3.5  ? ?CBG: ?Recent Labs  ?Lab 11/09/21 ?2249 11/10/21 ?4193 11/10/21 ?1635 11/11/21 ?7902  ?GLUCAP 137* 138* 120* 147*  ? ? ?Discharge time spent: less than 30 minutes. ? ?Signed: ?Brendia Sacks, MD ?[facility] ?11/11/2021 ? ? ? ? ? ? ? ? ?

## 2021-11-17 LAB — GLUCOSE, CAPILLARY
Glucose-Capillary: 156 mg/dL — ABNORMAL HIGH (ref 70–99)
Glucose-Capillary: 205 mg/dL — ABNORMAL HIGH (ref 70–99)

## 2022-05-09 ENCOUNTER — Telehealth: Payer: Self-pay | Admitting: Family Medicine

## 2022-05-09 NOTE — Telephone Encounter (Signed)
Daughter called to report pt passed on 03/02/2022

## 2022-05-14 NOTE — Telephone Encounter (Signed)
Card placed in mail 

## 2022-05-16 ENCOUNTER — Ambulatory Visit: Payer: Self-pay | Admitting: Family Medicine
